# Patient Record
Sex: Female | Born: 1970 | Race: White | Hispanic: No | Marital: Married | Smoking: Former smoker
Health system: Southern US, Community
[De-identification: ages and names within clinical notes are randomized; demographics above are authoritative.]

## PROBLEM LIST (undated history)

## (undated) DIAGNOSIS — O039 Complete or unspecified spontaneous abortion without complication: Secondary | ICD-10-CM

## (undated) DIAGNOSIS — B977 Papillomavirus as the cause of diseases classified elsewhere: Secondary | ICD-10-CM

## (undated) DIAGNOSIS — T7840XA Allergy, unspecified, initial encounter: Secondary | ICD-10-CM

## (undated) HISTORY — DX: Allergy, unspecified, initial encounter: T78.40XA

## (undated) HISTORY — DX: Papillomavirus as the cause of diseases classified elsewhere: B97.7

## (undated) HISTORY — DX: Complete or unspecified spontaneous abortion without complication: O03.9

---

## 2005-09-15 HISTORY — PX: COLONOSCOPY: SHX174

## 2008-12-14 HISTORY — PX: BASAL CELL CARCINOMA EXCISION: SHX1214

## 2010-10-23 ENCOUNTER — Encounter: Payer: Self-pay | Admitting: Family Medicine

## 2010-11-21 ENCOUNTER — Ambulatory Visit (INDEPENDENT_AMBULATORY_CARE_PROVIDER_SITE_OTHER): Payer: BC Managed Care – PPO | Admitting: Family Medicine

## 2010-11-21 ENCOUNTER — Other Ambulatory Visit: Payer: Self-pay | Admitting: Family Medicine

## 2010-11-21 ENCOUNTER — Other Ambulatory Visit (HOSPITAL_COMMUNITY)
Admission: RE | Admit: 2010-11-21 | Discharge: 2010-11-21 | Disposition: A | Discharge status: Home / Self Care | Payer: BC Managed Care – PPO | Source: Ambulatory Visit | Attending: Family Medicine | Admitting: Family Medicine

## 2010-11-21 ENCOUNTER — Encounter: Payer: Self-pay | Admitting: Family Medicine

## 2010-11-21 DIAGNOSIS — Z01419 Encounter for gynecological examination (general) (routine) without abnormal findings: Secondary | ICD-10-CM | POA: Insufficient documentation

## 2010-11-21 DIAGNOSIS — Z85828 Personal history of other malignant neoplasm of skin: Secondary | ICD-10-CM | POA: Insufficient documentation

## 2010-11-21 DIAGNOSIS — Z1159 Encounter for screening for other viral diseases: Secondary | ICD-10-CM | POA: Insufficient documentation

## 2010-11-26 ENCOUNTER — Other Ambulatory Visit: Payer: Self-pay | Admitting: Family Medicine

## 2010-11-26 DIAGNOSIS — Z1231 Encounter for screening mammogram for malignant neoplasm of breast: Secondary | ICD-10-CM

## 2010-11-26 NOTE — Assessment & Plan Note (Signed)
Summary: NOV:CPE and pap   Vital Signs:  Patient profile:   40 year old female Height:      72 inches Weight:      178 pounds Pulse rate:   74 / minute BP sitting:   140 / 91  (right arm) Cuff size:   regular  Vitals Entered By: Avon Gully CMA, Duncan Dull) (November 21, 2010 10:03 AM) CC: NP-est care    CC:  NP-est care.  History of Present Illness: Has had some pigastric discomfort scine went to the [place]. Complete course of prilosec OTC for 14 day and that did help. Her mother had colon Ca at age 23 so she needs her screening now. Diud have one 5 years ago when lived in [place].   Habits & Providers  Alcohol-Tobacco-Diet     Alcohol drinks/day: 1     Tobacco Status: quit     Year Quit: 1992  Exercise-Depression-Behavior     Does Patient Exercise: yes     STD Risk: never     Drug Use: no     Seat Belt Use: always  Current Medications (verified): 1)  Ortho Tri-Cyclen Lo 0.18/0.215/0.25 Mg-25 Mcg Tabs (Norgestim-Eth Estrad Triphasic) 2)  Claritin 10 Mg Tabs (Loratadine)  Allergies (verified): No Known Drug Allergies  Comments:  Nurse/Medical Assistant: The patient's medications and allergies were reviewed with the patient and were updated in the Medication and Allergy Lists. Avon Gully CMA, Duncan Dull) (November 21, 2010 10:07 AM)  Past History:  Past Medical History: 1 miscarriage  Past Surgical History: Mohs/repair to left eye for Basal Cell Cancer 12/2008  Family History: GM with DM Father with hi chol, HTN Mom with colon Ca, age 66  Social History: Tree surgeon. Self employed.  MBA.  Married to [place] with 2 boys.   Former Smoker, quit 1992 Alcohol use-yes Drug use-no Regular exercise-yes 2 caffeinated drinks per day. Smoking Status:  quit Does Patient Exercise:  yes STD Risk:  never Drug Use:  no Seat Belt Use:  always  Review of Systems       No fever/sweats/weakness, unexplained weight loss/gain.  No vison changes.  No  difficulty hearing/ringing in ears, hay fever/allergies.  No chest pain/discomfort, palpitations.  No Br lump/nipple discharge.  No cough/wheeze.  No blood in BM, nausea/vomiting/diarrhea.  No nighttime urination, leaking urine, unusual vaginal bleeding, discharge (penis or vagina).  No muscle/joint pain. No rash, change in mole.  No HA, memory loss.  No anxiety, sleep d/o, depression.  No easy bruising/bleeding, unexplained lump   Physical Exam  General:  Well-developed,well-nourished,in no acute distress; alert,appropriate and cooperative throughout examination Head:  Normocephalic and atraumatic without obvious abnormalities. No apparent alopecia or balding. Eyes:  No corneal or conjunctival inflammation noted. EOMI. Perrla.  Ears:  External ear exam shows no significant lesions or deformities.  Otoscopic examination reveals clear canals, tympanic membranes are intact bilaterally without bulging, retraction, inflammation or discharge. Hearing is grossly normal bilaterally. Nose:  External nasal examination shows no deformity or inflammation.  Mouth:  Oral mucosa and oropharynx without lesions or exudates.  Teeth in good repair. Neck:  No deformities, masses, or tenderness noted. Chest Wall:  No deformities, masses, or tenderness noted. Breasts:  No mass, nodules, thickening, tenderness, bulging, retraction, inflamation, nipple discharge or skin changes noted.   Lungs:  Normal respiratory effort, chest expands symmetrically. Lungs are clear to auscultation, no crackles or wheezes. Heart:  Normal rate and regular rhythm. S1 and S2 normal without gallop, murmur,  click, rub or other extra sounds. Abdomen:  Bowel sounds positive,abdomen soft and non-tender without masses, organomegaly or hernias noted. Genitalia:  Normal introitus for age, no external lesions, no vaginal discharge, mucosa pink and moist, no vaginal or cervical lesions, no vaginal atrophy, no friaility or hemorrhage, normal uterus size  and position, no adnexal masses or tenderness Msk:  No deformity or scoliosis noted of thoracic or lumbar spine.   Pulses:  R and L carotid,radial,dorsalis pedis and posterior tibial pulses are full and equal bilaterally Extremities:  No clubbing, cyanosis, edema, or deformity noted with normal full range of motion of all joints.   Neurologic:  No cranial nerve deficits noted. Station and gait are normal.  Sensory, motor and coordinative functions appear intact. Skin:  no rashes.   Cervical Nodes:  No lymphadenopathy noted Axillary Nodes:  No palpable lymphadenopathy Psych:  Cognition and judgment appear intact. Alert and cooperative with normal attention span and concentration. No apparent delusions, illusions, hallucinations   Impression & Recommendations:  Problem # 1:  ROUTINE GYNECOLOGICAL EXAMINATION (ICD-V72.31)  Orders: T-Comprehensive Metabolic Panel ([id number]) T-Lipid Profile ([id number]) T-Mammography Bilateral Screening (30865)  Complete Medication List: 1)  Ortho Tri-cyclen Lo 0.18/0.215/0.25 Mg-25 Mcg Tabs (Norgestim-eth estrad triphasic) 2)  Claritin 10 Mg Tabs (Loratadine)  Other Orders: Gastroenterology Referral (GI) Dermatology Referral (Derma)  Patient Instructions: 1)  We will call you with the colon screening referral 2)  We will call you with the labs results.   3)  Keep up the regular exercise.  4)  Take calcium +vitamin D daily.  Prescriptions: ORTHO TRI-CYCLEN LO 0.18/0.215/0.25 MG-25 MCG TABS (NORGESTIM-ETH ESTRAD TRIPHASIC)   #90 x 3   Entered and Authorized by:   Nani Gasser MD   Signed by:   Nani Gasser MD on 11/21/2010   Method used:   Print then Give to Patient   RxID:   [id number]    Orders Added: 1)  New Patient age 50-64 [99386] 2)  T-Comprehensive Metabolic Panel [[id number]] 3)  T-Lipid Profile [[id number]] 4)  T-Mammography Bilateral Screening [77057] 5)  Gastroenterology Referral [GI] 6)  Dermatology  Referral [Derma]   Immunization History:  Tetanus/Td Immunization History:    Tetanus/Td:  historical (09/15/2009)  Influenza Immunization History:    Influenza:  state fluvax nasal (06/15/2010)   Immunization History:  Tetanus/Td Immunization History:    Tetanus/Td:  Historical (09/15/2009)  Influenza Immunization History:    Influenza:  State Fluvax Nasal (06/15/2010)   Immunization History:  Tetanus/Td Immunization History:    Tetanus/Td:  historical (09/15/2009)  Influenza Immunization History:    Influenza:  state fluvax nasal (06/15/2010)    Preventive Care Screening  Colonoscopy:    Date:  09/15/2005    Next Due:  09/2010    Results:  normal   Last Tetanus Booster:    Date:  09/15/2009    Results:  Historical

## 2010-11-28 ENCOUNTER — Encounter: Payer: Self-pay | Admitting: Family Medicine

## 2010-11-28 LAB — CYTOLOGY - PAP: Pap Smear: NORMAL

## 2010-11-29 LAB — CONVERTED CEMR LAB
ALT: 15 units/L (ref 0–35)
AST: 21 units/L (ref 0–37)
Alkaline Phosphatase: 33 units/L — ABNORMAL LOW (ref 39–117)
Glucose, Bld: 86 mg/dL (ref 70–99)
LDL Cholesterol: 90 mg/dL (ref 0–99)
Potassium: 5 meq/L (ref 3.5–5.3)
Sodium: 137 meq/L (ref 135–145)
Total Bilirubin: 0.5 mg/dL (ref 0.3–1.2)
Total Protein: 6.5 g/dL (ref 6.0–8.3)
Triglycerides: 120 mg/dL (ref <150)
VLDL: 24 mg/dL (ref 0–40)

## 2010-12-03 NOTE — Letter (Signed)
Summary: Registration, Billing Information  Registration, Billing Information   Imported By: Maryln Gottron 11/25/2010 15:23:12  _____________________________________________________________________  External Attachment:    Type:   Image     Comment:   External Document

## 2010-12-10 ENCOUNTER — Ambulatory Visit
Admission: RE | Admit: 2010-12-10 | Discharge: 2010-12-10 | Disposition: A | Discharge status: Home / Self Care | Payer: BC Managed Care – PPO | Source: Ambulatory Visit | Attending: Family Medicine | Admitting: Family Medicine

## 2010-12-10 DIAGNOSIS — Z1231 Encounter for screening mammogram for malignant neoplasm of breast: Secondary | ICD-10-CM

## 2010-12-11 ENCOUNTER — Encounter: Payer: Self-pay | Admitting: Family Medicine

## 2010-12-17 ENCOUNTER — Telehealth: Payer: Self-pay | Admitting: *Deleted

## 2010-12-17 ENCOUNTER — Encounter: Payer: Self-pay | Admitting: Family Medicine

## 2010-12-17 NOTE — Telephone Encounter (Signed)
Message copied by Avon Gully on Tue Dec 17, 2010  2:59 PM ------      Message from: Nani Gasser      Created: Thu Dec 12, 2010  4:11 PM       Call pt: normal mammogram.

## 2010-12-17 NOTE — Telephone Encounter (Signed)
Pt.notified

## 2011-01-20 ENCOUNTER — Encounter: Payer: Self-pay | Admitting: Family Medicine

## 2011-01-23 ENCOUNTER — Ambulatory Visit (INDEPENDENT_AMBULATORY_CARE_PROVIDER_SITE_OTHER): Payer: BC Managed Care – PPO | Admitting: Family Medicine

## 2011-01-23 ENCOUNTER — Telehealth: Payer: Self-pay | Admitting: Family Medicine

## 2011-01-23 DIAGNOSIS — H109 Unspecified conjunctivitis: Secondary | ICD-10-CM

## 2011-01-23 DIAGNOSIS — S99929A Unspecified injury of unspecified foot, initial encounter: Secondary | ICD-10-CM

## 2011-01-23 DIAGNOSIS — J45990 Exercise induced bronchospasm: Secondary | ICD-10-CM | POA: Insufficient documentation

## 2011-01-23 MED ORDER — ALBUTEROL SULFATE HFA 108 (90 BASE) MCG/ACT IN AERS: 2.0000 | INHALATION_SPRAY | Freq: Four times a day (QID) | RESPIRATORY_TRACT | Status: DC | PRN

## 2011-01-23 MED ORDER — AZITHROMYCIN 1 % OP SOLN: 1.0000 [drp] | Freq: Two times a day (BID) | OPHTHALMIC | Status: AC

## 2011-01-23 NOTE — Progress Notes (Signed)
  Subjective:    Patient ID: Tammy Hoffman, female    DOB: 1971-01-15, 40 y.o.   MRN: [id number]  HPI Hx of seasonal allergies with post nasal drip but left eye started feeling ithcy. Started systane eye drops and that didn't help. Had also tried alavert eye drops and made the eye less dry.  Eye has been watering some.  Small amt of crust in the AM.  Usually rubs with a wet wash cloths.  Doesn't wear contacts.  She complains that the upper inner quadrant of her eyeball is where she is noticing the itchiness and discomfort. No vision change or headache.  Left foot , 3rd toe, thinks may have fractured it. She said she tripped over something and wonders if it may be fractured. It has been swollen and bruised. She says it is very tender but was still able to get a workout class today.  Hx of exercise induced asthma.  She's been working out really hard for the last year and says every time she takes this one class would have to run up and down the stairs she gets extremely short of breath. She said she looks around at the other classmates and notices that they are working hard to breathe but not as much as she has. Since she's been doing this class for almost a year she felt like her endurance should be more than he is. She has not used an inhaler since childhood. She used to play basketball as a teenager and says of inhaler use to help her. She denies any fever cough or other URI symptoms.  Review of Systems     Objective:   Physical Exam  Constitutional: She appears well-developed and well-nourished.  HENT:  Head: Normocephalic and atraumatic.  Eyes: EOM are normal. Pupils are equal, round, and reactive to light.        Conjunctiva are mildly inflamed bilaterally. I did use a fluorescin stain on the left eye and there was a small scratch over the upper inner quadrant on the iris.  Cardiovascular: Normal rate, regular rhythm and normal heart sounds.   Pulmonary/Chest: Effort normal and breath sounds  normal.  Musculoskeletal:       Left foot third toe is bruised and swollen. Normal range of motion slight tenderness over the distal joint.  Skin: Skin is warm and dry.          Assessment & Plan:  Left eye with abrasion-Cipro drops were applied in the office and a prescription for azithromycin was sent to her pharmacy to start tomorrow. If she is not feeling significantly better within 2-3 days she is to call the office. And if she has any sudden vision changes she is to also call the office immediately.  Toe injury-it is possible she has a fracture of ih be given x-ray. But likely will improve if she just buddy taped her toes. I explained how to do this. She can also wear a flat shoe and we can provide a postop shoe if she is having significant discomfort. She felt like she would just buddy tape the toes she was able to get her workout class.

## 2011-01-23 NOTE — Telephone Encounter (Signed)
Pt called and has been having LT eye itchy, red, tearing, burning, and feeling like something in it.  Very dry feeling yesterday and used systane eye gtts and alavert eye gtts but not helping.  Denies fever; however, sneezing, and clear drainage today from LY eye.  Uses [facility].  NKDA.  Wants to get RX eye gtts. Please advise. Plan:  Routed to Dr. Marlyne Beards, LPN [facility]

## 2011-01-23 NOTE — Assessment & Plan Note (Signed)
She has a childhood history of exercise-induced asthma I think it would be okay to do a trial of albuterol preexercise before class. It does help significantly then we can continue its use and she might use it before her other classes to see if she has better endurance. If this does not help her symptoms then we can consider further testing with spirometry or full PFTs. We can also get a chest x-ray to rule out other lesions.

## 2011-01-23 NOTE — Telephone Encounter (Deleted)
Pt notified needs appt.  Pt can come today, therefore, will schedule appt for today with provider for 1:00pm dbl-book and okayed by Dr. Linford Arnold. Jarvis Newcomer, LPN [facility]

## 2011-01-23 NOTE — Telephone Encounter (Deleted)
Pt notified needs appt today and Dr. Linford Arnold said okay to double book her for acute visit.  Pt willing to come for appt today at 1:00pm. Added dbl-book on to the schedule . Jarvis Newcomer, LPN [facility] Dimes

## 2011-01-23 NOTE — Telephone Encounter (Signed)
Needs appt. Can double book for today if needed.

## 2011-01-28 ENCOUNTER — Encounter: Payer: Self-pay | Admitting: Family Medicine

## 2011-08-18 ENCOUNTER — Encounter: Payer: Self-pay | Admitting: Family Medicine

## 2011-08-18 ENCOUNTER — Ambulatory Visit (INDEPENDENT_AMBULATORY_CARE_PROVIDER_SITE_OTHER): Payer: BC Managed Care – PPO | Admitting: Family Medicine

## 2011-08-18 DIAGNOSIS — M2140 Flat foot [pes planus] (acquired), unspecified foot: Secondary | ICD-10-CM

## 2011-08-18 DIAGNOSIS — M25579 Pain in unspecified ankle and joints of unspecified foot: Secondary | ICD-10-CM

## 2011-08-18 NOTE — Patient Instructions (Signed)
Please give her the phone number for Dr. Norton Blizzard in [facility] for orthotics.

## 2011-08-18 NOTE — Progress Notes (Signed)
  Subjective:    Patient ID: Tammy Hoffman, female    DOB: Jun 01, 1971, 40 y.o.   MRN: [id number]  HPI Started working out at the gym regularly about 5 years ago. Noticed her feet would be sore and would try to change shoes. Had a nerve problem in her right foot and had to stop wearing flipflops.  Says her feet have flattened. Says used to be a size 11 and now a 13.  Feels she is starting to pronate. They hurt a lot at the bottom of the medial malleolus.  Pain is in both but worse in the left foot.  Worse after she works out.  She now has adjust her workout bc of this. Occ uses aleve and ice packs and stretches.    Review of Systems     Objective:   Physical Exam  Feet with DP pulses 2+. Ankles with NROM.  Toes are nontender.  Ankles are nontender. No swelling or redness. 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       Assessment & Plan:  Foot pain - pronation. Arch collapse. Discussed the need for orthotics. Will refer to [facility] in [facility].  I don't want this to affect her ability to work out and continue her exercise regimen.

## 2011-08-19 ENCOUNTER — Ambulatory Visit (INDEPENDENT_AMBULATORY_CARE_PROVIDER_SITE_OTHER): Payer: BC Managed Care – PPO | Admitting: Family Medicine

## 2011-08-19 ENCOUNTER — Encounter: Payer: Self-pay | Admitting: Family Medicine

## 2011-08-19 DIAGNOSIS — M79609 Pain in unspecified limb: Secondary | ICD-10-CM

## 2011-08-19 DIAGNOSIS — M79673 Pain in unspecified foot: Secondary | ICD-10-CM

## 2011-08-19 DIAGNOSIS — M2140 Flat foot [pes planus] (acquired), unspecified foot: Secondary | ICD-10-CM

## 2011-08-21 ENCOUNTER — Encounter: Payer: Self-pay | Admitting: Family Medicine

## 2011-08-21 DIAGNOSIS — M79673 Pain in unspecified foot: Secondary | ICD-10-CM | POA: Insufficient documentation

## 2011-08-21 DIAGNOSIS — M2140 Flat foot [pes planus] (acquired), unspecified foot: Secondary | ICD-10-CM | POA: Insufficient documentation

## 2011-08-21 NOTE — Progress Notes (Signed)
Subjective:    Patient ID: Tammy Hoffman, female    DOB: 07/19/1971, 40 y.o.   MRN: [id number]  PCP: Dr. Linford Arnold  HPI 40 yo F here for bilateral foot pain.  Patient denies known injury. She is very active in working out, running, cross training. States over past 5 years has had intermittent but worsening bilateral foot pain. Worse at end of the day and after doing jumping activities. States pain is not in a consistent location. Has developed worsening pes planus over the years as well. Tried superfeet, different shoes - has not tried custom orthotics in the past. No bruising. No numbness or tingling.  Past Medical History  Diagnosis Date  . Miscarriage   . Allergy   . Asthma     Current Outpatient Prescriptions on File Prior to Visit  Medication Sig Dispense Refill  . albuterol (PROAIR HFA) 108 (90 BASE) MCG/ACT inhaler Inhale 2 puffs into the lungs every 6 (six) hours as needed for wheezing.  1 Inhaler  2  . loratadine (CLARITIN) 10 MG tablet Take 10 mg by mouth daily.        . Norgestimate-Ethinyl Estradiol Triphasic (ORTHO TRI-CYCLEN LO) 0.18/0.215/0.25 MG-25 MCG tablet Take 1 tablet by mouth daily.          Past Surgical History  Procedure Date  . Basal cell carcinoma excision 12/2008    Left eye  . Colonoscopy 09/15/2005    No Known Allergies  History   Social History  . Marital Status: Married    Spouse Name: Molli Hazard    Number of Children: 2  . Years of Education: MBA   Occupational History  . computer analyst     self employed   Social History Main Topics  . Smoking status: Former Smoker    Quit date: 09/15/1990  . Smokeless tobacco: Not on file  . Alcohol Use: Yes  . Drug Use: No  . Sexually Active: Not on file   Other Topics Concern  . Not on file   Social History Narrative   Regular exercise.  2 caffeinated drinks per day.      Family History  Problem Relation Age of Onset  . Diabetes      GM  . Hyperlipidemia Father   . Hypertension  Father   . Colon cancer Mother 36  . Diabetes Maternal Uncle   . Diabetes Maternal Grandmother   . Heart attack Maternal Grandfather   . Hyperlipidemia Paternal Grandmother   . Hypertension Paternal Grandmother     BP 117/82  Pulse 76  Temp(Src) 98.1 F (36.7 C) (Oral)  Ht 5\' 11"  (1.803 m)  Wt 170 lb (77.111 kg)  BMI 23.71 kg/m2  LMP 08/18/2011  Review of Systems See HPI.    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 No leg length inequality. No transverse arch breakdown. No hallux valgus or rigidus. No other deformity, swelling, or bruising. No focal TTP throughout foot or ankle. FROM with 5/5 strength all motions of ankle and toes. Negative ant drawer and talar tilt. NVI distally.    Assessment & Plan:  1. Bilateral foot pain - By exam and patient's history does not appear she has a focal finding that would suggest a tendinopathy, stress fracture, other abnormality that would lend itself to a specific treatment plan.  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  Will try  comforthotics with small scaphoid pads over next month.  Depending on if she does well with these or likes them, advised her at f/u in 1 month would either make her custom orthotics or show her how to order these comforthotics from [facility].  Patient agreeable to treatment plan.

## 2011-08-21 NOTE — Assessment & Plan Note (Signed)
By exam and patient's history does not appear she has a focal finding that would suggest a tendinopathy, stress fracture, other abnormality that would lend itself to a specific treatment plan. 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 Will try comforthotics with small scaphoid pads over next month. Depending on if she does well with these or likes them, advised her at f/u in 1 month would either make her custom orthotics or show her how to order these comforthotics from [facility]. Patient agreeable to treatment plan.

## 2011-09-05 ENCOUNTER — Encounter: Payer: Self-pay | Admitting: Family Medicine

## 2011-09-30 ENCOUNTER — Ambulatory Visit (INDEPENDENT_AMBULATORY_CARE_PROVIDER_SITE_OTHER): Payer: BC Managed Care – PPO | Admitting: Family Medicine

## 2011-09-30 ENCOUNTER — Encounter: Payer: Self-pay | Admitting: Family Medicine

## 2011-09-30 VITALS — BP 132/89 | HR 67 | Temp 98.2°F | Ht 71.0 in | Wt 170.0 lb

## 2011-09-30 DIAGNOSIS — M79609 Pain in unspecified limb: Secondary | ICD-10-CM

## 2011-09-30 DIAGNOSIS — M79673 Pain in unspecified foot: Secondary | ICD-10-CM

## 2011-09-30 NOTE — Progress Notes (Signed)
Subjective:    Patient ID: Tammy Hoffman, female    DOB: April 08, 1971, 41 y.o.   MRN: [id number]  PCP: Dr. Linford Arnold  HPI  41 yo F here for f/u bilateral foot pain.  12/4: Patient denies known injury. She is very active in working out, running, cross training. States over past 5 years has had intermittent but worsening bilateral foot pain. Worse at end of the day and after doing jumping activities. States pain is not in a consistent location. Has developed worsening pes planus over the years as well. Tried superfeet, different shoes - has not tried custom orthotics in the past. No bruising. No numbness or tingling.  1/15: Patient reports pain is much better. Able to work out without limitations though still notices if she does a lot of jumping activities or stairs she has soreness in bilateral feet after workouts. Would like to order temporary insoles from [facility] - done very well with these and pleased with progress. No new injuries.  Past Medical History  Diagnosis Date  . Miscarriage   . Allergy   . Asthma     Current Outpatient Prescriptions on File Prior to Visit  Medication Sig Dispense Refill  . albuterol (PROAIR HFA) 108 (90 BASE) MCG/ACT inhaler Inhale 2 puffs into the lungs every 6 (six) hours as needed for wheezing.  1 Inhaler  2  . loratadine (CLARITIN) 10 MG tablet Take 10 mg by mouth daily.        . Norgestimate-Ethinyl Estradiol Triphasic (ORTHO TRI-CYCLEN LO) 0.18/0.215/0.25 MG-25 MCG tablet Take 1 tablet by mouth daily.          Past Surgical History  Procedure Date  . Basal cell carcinoma excision 12/2008    Left eye  . Colonoscopy 09/15/2005    No Known Allergies  History   Social History  . Marital Status: Married    Spouse Name: Molli Hazard    Number of Children: 2  . Years of Education: MBA   Occupational History  . computer analyst     self employed   Social History Main Topics  . Smoking status: Former Smoker    Quit date: 09/15/1990  .  Smokeless tobacco: Not on file  . Alcohol Use: Yes  . Drug Use: No  . Sexually Active: Not on file   Other Topics Concern  . Not on file   Social History Narrative   Regular exercise.  2 caffeinated drinks per day.      Family History  Problem Relation Age of Onset  . Diabetes      GM  . Hyperlipidemia Father   . Hypertension Father   . Colon cancer Mother 46  . Diabetes Maternal Uncle   . Diabetes Maternal Grandmother   . Heart attack Maternal Grandfather   . Hyperlipidemia Paternal Grandmother   . Hypertension Paternal Grandmother     BP 132/89  Pulse 67  Temp(Src) 98.2 F (36.8 C) (Oral)  Ht 5\' 11"  (1.803 m)  Wt 170 lb (77.111 kg)  BMI 23.71 kg/m2  Review of Systems  See HPI.    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 No transverse arch breakdown. No hallux valgus or rigidus. No other deformity, swelling, or bruising. No focal TTP throughout foot or ankle. FROM with 5/5 strength all motions of ankle and toes. Negative ant drawer and talar tilt. NVI distally.    Assessment & Plan:  1. Bilateral foot pain -  Exam reassuring again today.  No evidence of findings of a tendinopathy, stress fracture, other abnormality that would lend itself to a specific treatment plan.  Improved with sports insoles + scaphoid pads.  Shown today how to order these from [facility].  To check with her insurance to see if they cover custom orthotics - advised her if they do would be happy to make her a pair of these.  Otherwise f/u prn.

## 2011-09-30 NOTE — Assessment & Plan Note (Signed)
Exam reassuring again today.  No evidence of findings of a tendinopathy, stress fracture, other abnormality that would lend itself to a specific treatment plan.  Improved with sports insoles + scaphoid pads.  Shown today how to order these from [facility].  To check with her insurance to see if they cover custom orthotics - advised her if they do would be happy to make her a pair of these.  Otherwise f/u prn.

## 2011-11-24 ENCOUNTER — Encounter: Payer: Self-pay | Admitting: Physician Assistant

## 2011-11-24 ENCOUNTER — Ambulatory Visit (INDEPENDENT_AMBULATORY_CARE_PROVIDER_SITE_OTHER): Payer: BC Managed Care – PPO | Admitting: Physician Assistant

## 2011-11-24 VITALS — BP 138/96 | HR 92 | Ht 71.0 in | Wt 174.0 lb

## 2011-11-24 DIAGNOSIS — R030 Elevated blood-pressure reading, without diagnosis of hypertension: Secondary | ICD-10-CM

## 2011-11-24 DIAGNOSIS — M545 Low back pain: Secondary | ICD-10-CM

## 2011-11-24 MED ORDER — CYCLOBENZAPRINE HCL 5 MG PO TABS: 7.5000 mg | ORAL_TABLET | Freq: Three times a day (TID) | ORAL | Status: DC | PRN

## 2011-11-24 MED ORDER — IBUPROFEN 800 MG PO TABS: 800.0000 mg | ORAL_TABLET | Freq: Three times a day (TID) | ORAL | Status: AC | PRN

## 2011-11-24 NOTE — Progress Notes (Signed)
  Subjective:    Patient ID: Tammy Hoffman, female    DOB: 21-Aug-1971, 41 y.o.   MRN: [id number]  HPI Patient presents to the clinic for lumbar back pain. Patient has a history of lower back pain that will acutely get worse and then resolve. The most recent episode started Thursday night when her lower back muscle felt tight. She works out every day and does not remember an injury. She continued to feel tight until Saturday when she got a massage. After the massage on Saturday(2 days ago) everything got worse. She rates pain 7/10 and has trouble walking or moving at all. Laying down makes it feel the best. Walking and standing makes it feel the worse. She denies any bowel or urinary disfunction. She has had some radiation of pain down left leg. She has tried heating pad and perigylesic. It has not helped.     Review of Systems     Objective:   Physical Exam  Constitutional: She is oriented to person, place, and time. She appears well-developed and well-nourished.  HENT:  Head: Normocephalic and atraumatic.  Cardiovascular: Normal rate, regular rhythm and normal heart sounds.   Pulmonary/Chest: Effort normal and breath sounds normal. She has no wheezes.  Musculoskeletal:       Patient was not able to do any forward flexion or backward extension at the waist. She was able to minimally twist at the waist. Passive ROM of both legs while laying on the table was full ROM. Positive straight leg test of left leg it was negative on right leg. Not able to test strength patient could not resist at all. Tenderness to palpation over left lumbar area.  Neurological: She is alert and oriented to person, place, and time. She has normal reflexes.  Psychiatric: She has a normal mood and affect. Her behavior is normal.          Assessment & Plan:  Low back strain- Give Ibuprofen 800mg  up to three times a day. Start Flexeril to start up three times a day. Start exerices and stretches. Encouraged patient to  keep moving as much as possible not to just lay in the bed. Patient has trip planned for [place] in 5 days. Will consider giving pain medication if not improved before then.    Elevated blood pressure- Discussed elevated blood pressure and salt intake as well as diet. Check bp a couple times a week and bring in to CPE. Patient has family history of HTN and we discussed genetics with hypertension.

## 2011-11-24 NOTE — Patient Instructions (Addendum)
Give Ibuprofen 800mg  up to three times a day. Start Flexeril to start up three times a day. Start exerices and stretches. Discussed elevated blood pressure and salt intake due to diet. Check bp a couple times a week and bring in to CPE.   Low Back Strain with Rehab A strain is an injury in which a tendon or muscle is torn. The muscles and tendons of the lower back are vulnerable to strains. However, these muscles and tendons are very strong and require a great force to be injured. Strains are classified into three categories. Grade 1 strains cause pain, but the tendon is not lengthened. Grade 2 strains include a lengthened ligament, due to the ligament being stretched or partially ruptured. With grade 2 strains there is still function, although the function may be decreased. Grade 3 strains involve a complete tear of the tendon or muscle, and function is usually impaired. SYMPTOMS   Pain in the lower back.   Pain that affects one side more than the other.   Pain that gets worse with movement and may be felt in the hip, buttocks, or back of the thigh.   Muscle spasms of the muscles in the back.   Swelling along the muscles of the back.   Loss of strength of the back muscles.   Crackling sound (crepitation) when the muscles are touched.  CAUSES  Lower back strains occur when a force is placed on the muscles or tendons that is greater than they can handle. Common causes of injury include:  Prolonged overuse of the muscle-tendon units in the lower back, usually from incorrect posture.   A single violent injury or force applied to the back.  RISK INCREASES WITH:  Sports that involve twisting forces on the spine or a lot of bending at the waist (football, rugby, weightlifting, bowling, golf, tennis, speed skating, racquetball, swimming, running, gymnastics, diving).   Poor strength and flexibility.   Failure to warm up properly before activity.   Family history of lower back pain or disk  disorders.   Previous back injury or surgery (especially fusion).   Poor posture with lifting, especially heavy objects.   Prolonged sitting, especially with poor posture.  PREVENTION   Learn and use proper posture when sitting or lifting (maintain proper posture when sitting, lift using the knees and legs, not at the waist).   Warm up and stretch properly before activity.   Allow for adequate recovery between workouts.   Maintain physical fitness:   Strength, flexibility, and endurance.   Cardiovascular fitness.  PROGNOSIS  If treated properly, lower back strains usually heal within 6 weeks. RELATED COMPLICATIONS   Recurring symptoms, resulting in a chronic problem.   Chronic inflammation, scarring, and partial muscle-tendon tear.   Delayed healing or resolution of symptoms.   Prolonged disability.  TREATMENT  Treatment first involves the use of ice and medicine, to reduce pain and inflammation. The use of strengthening and stretching exercises may help reduce pain with activity. These exercises may be performed at home or with a therapist. Severe injuries may require referral to a therapist for further evaluation and treatment, such as ultrasound. Your caregiver may advise that you wear a back brace or corset, to help reduce pain and discomfort. Often, prolonged bed rest results in greater harm then benefit. Corticosteroid injections may be recommended. However, these should be reserved for the most serious cases. It is important to avoid using your back when lifting objects. At night, sleep on your back  on a firm mattress with a pillow placed under your knees. If non-surgical treatment is unsuccessful, surgery may be needed.  MEDICATION   If pain medicine is needed, nonsteroidal anti-inflammatory medicines (aspirin and ibuprofen), or other minor pain relievers (acetaminophen), are often advised.   Do not take pain medicine for 7 days before surgery.   Prescription pain  relievers may be given, if your caregiver thinks they are needed. Use only as directed and only as much as you need.   Ointments applied to the skin may be helpful.   Corticosteroid injections may be given by your caregiver. These injections should be reserved for the most serious cases, because they may only be given a certain number of times.  HEAT AND COLD  Cold treatment (icing) should be applied for 10 to 15 minutes every 2 to 3 hours for inflammation and pain, and immediately after activity that aggravates your symptoms. Use ice packs or an ice massage.   Heat treatment may be used before performing stretching and strengthening activities prescribed by your caregiver, physical therapist, or athletic trainer. Use a heat pack or a warm water soak.  SEEK MEDICAL CARE IF:   Symptoms get worse or do not improve in 2 to 4 weeks, despite treatment.   You develop numbness, weakness, or loss of bowel or bladder function.   New, unexplained symptoms develop. (Drugs used in treatment may produce side effects.)  EXERCISES  RANGE OF MOTION (ROM) AND STRETCHING EXERCISES - Low Back Strain Most people with lower back pain will find that their symptoms get worse with excessive bending forward (flexion) or arching at the lower back (extension). The exercises which will help resolve your symptoms will focus on the opposite motion.  Your physician, physical therapist or athletic trainer will help you determine which exercises will be most helpful to resolve your lower back pain. Do not complete any exercises without first consulting with your caregiver. Discontinue any exercises which make your symptoms worse until you speak to your caregiver.  If you have pain, numbness or tingling which travels down into your buttocks, leg or foot, the goal of the therapy is for these symptoms to move closer to your back and eventually resolve. Sometimes, these leg symptoms will get better, but your lower back pain may  worsen. This is typically an indication of progress in your rehabilitation. Be very alert to any changes in your symptoms and the activities in which you participated in the 24 hours prior to the change. Sharing this information with your caregiver will allow him/her to most efficiently treat your condition.  These exercises may help you when beginning to rehabilitate your injury. Your symptoms may resolve with or without further involvement from your physician, physical therapist or athletic trainer. While completing these exercises, remember:   Restoring tissue flexibility helps normal motion to return to the joints. This allows healthier, less painful movement and activity.   An effective stretch should be held for at least 30 seconds.   A stretch should never be painful. You should only feel a gentle lengthening or release in the stretched tissue.  FLEXION RANGE OF MOTION AND STRETCHING EXERCISES: STRETCH - Flexion, Single Knee to Chest   Lie on a firm bed or floor with both legs extended in front of you.   Keeping one leg in contact with the floor, bring your opposite knee to your chest. Hold your leg in place by either grabbing behind your thigh or at your knee.   Pull  until you feel a gentle stretch in your lower back. Hold __________ seconds.   Slowly release your grasp and repeat the exercise with the opposite side.  Repeat __________ times. Complete this exercise __________ times per day.  STRETCH - Flexion, Double Knee to Chest   Lie on a firm bed or floor with both legs extended in front of you.   Keeping one leg in contact with the floor, bring your opposite knee to your chest.   Tense your stomach muscles to support your back and then lift your other knee to your chest. Hold your legs in place by either grabbing behind your thighs or at your knees.   Pull both knees toward your chest until you feel a gentle stretch in your lower back. Hold __________ seconds.   Tense your  stomach muscles and slowly return one leg at a time to the floor.  Repeat __________ times. Complete this exercise __________ times per day.  STRETCH - Low Trunk Rotation  Lie on a firm bed or floor. Keeping your legs in front of you, bend your knees so they are both pointed toward the ceiling and your feet are flat on the floor.   Extend your arms out to the side. This will stabilize your upper body by keeping your shoulders in contact with the floor.   Gently and slowly drop both knees together to one side until you feel a gentle stretch in your lower back. Hold for __________ seconds.   Tense your stomach muscles to support your lower back as you bring your knees back to the starting position. Repeat the exercise to the other side.  Repeat __________ times. Complete this exercise __________ times per day  EXTENSION RANGE OF MOTION AND FLEXIBILITY EXERCISES: STRETCH - Extension, Prone on Elbows   Lie on your stomach on the floor, a bed will be too soft. Place your palms about shoulder width apart and at the height of your head.   Place your elbows under your shoulders. If this is too painful, stack pillows under your chest.   Allow your body to relax so that your hips drop lower and make contact more completely with the floor.   Hold this position for __________ seconds.   Slowly return to lying flat on the floor.  Repeat __________ times. Complete this exercise __________ times per day.  RANGE OF MOTION - Extension, Prone Press Ups  Lie on your stomach on the floor, a bed will be too soft. Place your palms about shoulder width apart and at the height of your head.   Keeping your back as relaxed as possible, slowly straighten your elbows while keeping your hips on the floor. You may adjust the placement of your hands to maximize your comfort. As you gain motion, your hands will come more underneath your shoulders.   Hold this position __________ seconds.   Slowly return to lying  flat on the floor.  Repeat __________ times. Complete this exercise __________ times per day.  RANGE OF MOTION- Quadruped, Neutral Spine   Assume a hands and knees position on a firm surface. Keep your hands under your shoulders and your knees under your hips. You may place padding under your knees for comfort.   Drop your head and point your tail bone toward the ground below you. This will round out your lower back like an angry cat. Hold this position for __________ seconds.   Slowly lift your head and release your tail bone so that your  back sags into a large arch, like an old horse.   Hold this position for __________ seconds.   Repeat this until you feel limber in your lower back.   Now, find your "sweet spot." This will be the most comfortable position somewhere between the two previous positions. This is your neutral spine. Once you have found this position, tense your stomach muscles to support your lower back.   Hold this position for __________ seconds.  Repeat __________ times. Complete this exercise __________ times per day.  STRENGTHENING EXERCISES - Low Back Strain These exercises may help you when beginning to rehabilitate your injury. These exercises should be done near your "sweet spot." This is the neutral, low-back arch, somewhere between fully rounded and fully arched, that is your least painful position. When performed in this safe range of motion, these exercises can be used for people who have either a flexion or extension based injury. These exercises may resolve your symptoms with or without further involvement from your physician, physical therapist or athletic trainer. While completing these exercises, remember:   Muscles can gain both the endurance and the strength needed for everyday activities through controlled exercises.   Complete these exercises as instructed by your physician, physical therapist or athletic trainer. Increase the resistance and repetitions  only as guided.   You may experience muscle soreness or fatigue, but the pain or discomfort you are trying to eliminate should never worsen during these exercises. If this pain does worsen, stop and make certain you are following the directions exactly. If the pain is still present after adjustments, discontinue the exercise until you can discuss the trouble with your caregiver.  STRENGTHENING - Deep Abdominals, Pelvic Tilt  Lie on a firm bed or floor. Keeping your legs in front of you, bend your knees so they are both pointed toward the ceiling and your feet are flat on the floor.   Tense your lower abdominal muscles to press your lower back into the floor. This motion will rotate your pelvis so that your tail bone is scooping upwards rather than pointing at your feet or into the floor.   With a gentle tension and even breathing, hold this position for __________ seconds.  Repeat __________ times. Complete this exercise __________ times per day.  STRENGTHENING - Abdominals, Crunches   Lie on a firm bed or floor. Keeping your legs in front of you, bend your knees so they are both pointed toward the ceiling and your feet are flat on the floor. Cross your arms over your chest.   Slightly tip your chin down without bending your neck.   Tense your abdominals and slowly lift your trunk high enough to just clear your shoulder blades. Lifting higher can put excessive stress on the lower back and does not further strengthen your abdominal muscles.   Control your return to the starting position.  Repeat __________ times. Complete this exercise __________ times per day.  STRENGTHENING - Quadruped, Opposite UE/LE Lift   Assume a hands and knees position on a firm surface. Keep your hands under your shoulders and your knees under your hips. You may place padding under your knees for comfort.   Find your neutral spine and gently tense your abdominal muscles so that you can maintain this position. Your  shoulders and hips should form a rectangle that is parallel with the floor and is not twisted.   Keeping your trunk steady, lift your right hand no higher than your shoulder and then your  left leg no higher than your hip. Make sure you are not holding your breath. Hold this position __________ seconds.   Continuing to keep your abdominal muscles tense and your back steady, slowly return to your starting position. Repeat with the opposite arm and leg.  Repeat __________ times. Complete this exercise __________ times per day.  STRENGTHENING - Lower Abdominals, Double Knee Lift  Lie on a firm bed or floor. Keeping your legs in front of you, bend your knees so they are both pointed toward the ceiling and your feet are flat on the floor.   Tense your abdominal muscles to brace your lower back and slowly lift both of your knees until they come over your hips. Be certain not to hold your breath.   Hold __________ seconds. Using your abdominal muscles, return to the starting position in a slow and controlled manner.  Repeat __________ times. Complete this exercise __________ times per day.  POSTURE AND BODY MECHANICS CONSIDERATIONS - Low Back Strain Keeping correct posture when sitting, standing or completing your activities will reduce the stress put on different body tissues, allowing injured tissues a chance to heal and limiting painful experiences. The following are general guidelines for improved posture. Your physician or physical therapist will provide you with any instructions specific to your needs. While reading these guidelines, remember:  The exercises prescribed by your provider will help you have the flexibility and strength to maintain correct postures.   The correct posture provides the best environment for your joints to work. All of your joints have less wear and tear when properly supported by a spine with good posture. This means you will experience a healthier, less painful body.    Correct posture must be practiced with all of your activities, especially prolonged sitting and standing. Correct posture is as important when doing repetitive low-stress activities (typing) as it is when doing a single heavy-load activity (lifting).  RESTING POSITIONS Consider which positions are most painful for you when choosing a resting position. If you have pain with flexion-based activities (sitting, bending, stooping, squatting), choose a position that allows you to rest in a less flexed posture. You would want to avoid curling into a fetal position on your side. If your pain worsens with extension-based activities (prolonged standing, working overhead), avoid resting in an extended position such as sleeping on your stomach. Most people will find more comfort when they rest with their spine in a more neutral position, neither too rounded nor too arched. Lying on a non-sagging bed on your side with a pillow between your knees, or on your back with a pillow under your knees will often provide some relief. Keep in mind, being in any one position for a prolonged period of time, no matter how correct your posture, can still lead to stiffness. PROPER SITTING POSTURE In order to minimize stress and discomfort on your spine, you must sit with correct posture. Sitting with good posture should be effortless for a healthy body. Returning to good posture is a gradual process. Many people can work toward this most comfortably by using various supports until they have the flexibility and strength to maintain this posture on their own. When sitting with proper posture, your ears will fall over your shoulders and your shoulders will fall over your hips. You should use the back of the chair to support your upper back. Your lower back will be in a neutral position, just slightly arched. You may place a small pillow or folded towel  at the base of your lower back for support.  When working at a desk, create an  environment that supports good, upright posture. Without extra support, muscles tire, which leads to excessive strain on joints and other tissues. Keep these recommendations in mind: CHAIR:  A chair should be able to slide under your desk when your back makes contact with the back of the chair. This allows you to work closely.   The chair's height should allow your eyes to be level with the upper part of your monitor and your hands to be slightly lower than your elbows.  BODY POSITION  Your feet should make contact with the floor. If this is not possible, use a foot rest.   Keep your ears over your shoulders. This will reduce stress on your neck and lower back.  INCORRECT SITTING POSTURES  If you are feeling tired and unable to assume a healthy sitting posture, do not slouch or slump. This puts excessive strain on your back tissues, causing more damage and pain. Healthier options include:  Using more support, like a lumbar pillow.   Switching tasks to something that requires you to be upright or walking.   Talking a brief walk.   Lying down to rest in a neutral-spine position.  PROLONGED STANDING WHILE SLIGHTLY LEANING FORWARD  When completing a task that requires you to lean forward while standing in one place for a long time, place either foot up on a stationary 2-4 inch high object to help maintain the best posture. When both feet are on the ground, the lower back tends to lose its slight inward curve. If this curve flattens (or becomes too large), then the back and your other joints will experience too much stress, tire more quickly, and can cause pain. CORRECT STANDING POSTURES Proper standing posture should be assumed with all daily activities, even if they only take a few moments, like when brushing your teeth. As in sitting, your ears should fall over your shoulders and your shoulders should fall over your hips. You should keep a slight tension in your abdominal muscles to brace your  spine. Your tailbone should point down to the ground, not behind your body, resulting in an over-extended swayback posture.  INCORRECT STANDING POSTURES  Common incorrect standing postures include a forward head, locked knees and/or an excessive swayback. WALKING Walk with an upright posture. Your ears, shoulders and hips should all line-up. PROLONGED ACTIVITY IN A FLEXED POSITION When completing a task that requires you to bend forward at your waist or lean over a low surface, try to find a way to stabilize 3 out of 4 of your limbs. You can place a hand or elbow on your thigh or rest a knee on the surface you are reaching across. This will provide you more stability so that your muscles do not fatigue as quickly. By keeping your knees relaxed, or slightly bent, you will also reduce stress across your lower back. CORRECT LIFTING TECHNIQUES DO :   Assume a wide stance. This will provide you more stability and the opportunity to get as close as possible to the object which you are lifting.   Tense your abdominals to brace your spine. Bend at the knees and hips. Keeping your back locked in a neutral-spine position, lift using your leg muscles. Lift with your legs, keeping your back straight.   Test the weight of unknown objects before attempting to lift them.   Try to keep your elbows locked down at  your sides in order get the best strength from your shoulders when carrying an object.   Always ask for help when lifting heavy or awkward objects.  INCORRECT LIFTING TECHNIQUES DO NOT:   Lock your knees when lifting, even if it is a small object.   Bend and twist. Pivot at your feet or move your feet when needing to change directions.   Assume that you can safely pick up even a paper clip without proper posture.  Document Released: 09/01/2005 Document Revised: 08/21/2011 Document Reviewed: 12/14/2008 [facility] Patient Information 2012 [place], [place].

## 2011-12-09 ENCOUNTER — Other Ambulatory Visit: Payer: Self-pay | Admitting: Family Medicine

## 2011-12-09 DIAGNOSIS — Z1231 Encounter for screening mammogram for malignant neoplasm of breast: Secondary | ICD-10-CM

## 2011-12-29 ENCOUNTER — Encounter: Payer: Self-pay | Admitting: *Deleted

## 2012-01-01 ENCOUNTER — Ambulatory Visit (INDEPENDENT_AMBULATORY_CARE_PROVIDER_SITE_OTHER): Payer: BC Managed Care – PPO | Admitting: Family Medicine

## 2012-01-01 ENCOUNTER — Encounter: Payer: Self-pay | Admitting: Family Medicine

## 2012-01-01 ENCOUNTER — Other Ambulatory Visit (HOSPITAL_COMMUNITY)
Admission: RE | Admit: 2012-01-01 | Discharge: 2012-01-01 | Disposition: A | Discharge status: Home / Self Care | Payer: BC Managed Care – PPO | Source: Ambulatory Visit | Attending: Family Medicine | Admitting: Family Medicine

## 2012-01-01 VITALS — BP 132/82 | HR 66 | Ht 71.0 in | Wt 175.0 lb

## 2012-01-01 DIAGNOSIS — Z01419 Encounter for gynecological examination (general) (routine) without abnormal findings: Secondary | ICD-10-CM

## 2012-01-01 MED ORDER — NORGESTIM-ETH ESTRAD TRIPHASIC 0.18/0.215/0.25 MG-25 MCG PO TABS: 1.0000 | ORAL_TABLET | Freq: Every day | ORAL | Status: DC

## 2012-01-01 MED ORDER — ALBUTEROL SULFATE HFA 108 (90 BASE) MCG/ACT IN AERS: 2.0000 | INHALATION_SPRAY | Freq: Four times a day (QID) | RESPIRATORY_TRACT | Status: DC | PRN

## 2012-01-01 NOTE — Patient Instructions (Signed)
Start a regular exercise program and make sure you are eating a healthy diet Try to eat 4 servings of dairy a day or take a calcium supplement (500mg  twice a day). Your vaccines are up to date.  We will call you with the pap smear results.

## 2012-01-01 NOTE — Progress Notes (Signed)
  Subjective:     Tammy Hoffman is a 41 y.o. female and is here for a comprehensive physical exam. The patient reports no problems. Wants to renew her OCPs. She is doing well.  She has questions about the shingles vaccine.  She did see Dr. Pearletha Forge for orthotics and that has helped. She wonders if could take a supplement called Move-Free.  Has been tracking her BP at home and  Has been 120s/70.  She wants to know if should see a chirpractor.    History   Social History  . Marital Status: Married    Spouse Name: Molli Hazard    Number of Children: 2  . Years of Education: MBA   Occupational History  . computer analyst     self employed   Social History Main Topics  . Smoking status: Former Smoker    Quit date: 09/15/1990  . Smokeless tobacco: Not on file  . Alcohol Use: Yes  . Drug Use: No  . Sexually Active: Not on file   Other Topics Concern  . Not on file   Social History Narrative   Regular exercise.  2 caffeinated drinks per day.     Health Maintenance  Topic Date Due  . Influenza Vaccine  06/15/2012  . Pap Smear  11/27/2013  . Tetanus/tdap  09/16/2019    The following portions of the patient's history were reviewed and updated as appropriate: allergies, current medications, past family history, past medical history, past social history, past surgical history and problem list.  Review of Systems A comprehensive review of systems was negative.   Objective:    BP 132/82  Pulse 66  Ht 5\' 11"  (1.803 m)  Wt 175 lb (79.379 kg)  BMI 24.41 kg/m2  LMP 12/25/2011 General appearance: alert, cooperative and appears stated age Head: Normocephalic, without obvious abnormality, atraumatic Eyes: conj clear, EOMi, PEERLA Ears: normal TM's and external ear canals both ears Nose: Nares normal. Septum midline. Mucosa normal. No drainage or sinus tenderness. Throat: lips, mucosa, and tongue normal; teeth and gums normal Neck: no adenopathy, no carotid bruit, no JVD, supple, symmetrical,  trachea midline and thyroid not enlarged, symmetric, no tenderness/mass/nodules Back: symmetric, no curvature. ROM normal. No CVA tenderness. Lungs: clear to auscultation bilaterally Breasts: normal appearance, no masses or tenderness Heart: regular rate and rhythm, S1, S2 normal, no murmur, click, rub or gallop Abdomen: soft, non-tender; bowel sounds normal; no masses,  no organomegaly Pelvic: cervix normal in appearance, external genitalia normal, no adnexal masses or tenderness, no cervical motion tenderness, rectovaginal septum normal, uterus normal size, shape, and consistency and vagina normal without discharge Extremities: extremities normal, atraumatic, no cyanosis or edema Pulses: 2+ and symmetric Skin: Skin color, texture, turgor normal. No rashes or lesions Lymph nodes: Cervical, supraclavicular, and axillary nodes normal. Neurologic: Grossly normal    Assessment:    Healthy female exam.      Plan:     See After Visit Summary for Counseling Recommendations  Start a regular exercise program and make sure you are eating a healthy diet Try to eat 4 servings of dairy a day or take a calcium supplement (500mg  twice a day). Your vaccines are up to date.   BP looks fantastic today.    Doing well with the orthotics.    Renew OCPs and inhaler. Sent to [facility].    Ok for chiropractor for her back and neck.  I think this could be really helpful.

## 2012-01-02 LAB — CBC WITH DIFFERENTIAL/PLATELET
Eosinophils Relative: 1 % (ref 0–5)
HCT: 37.8 % (ref 36.0–46.0)
Hemoglobin: 12.1 g/dL (ref 12.0–15.0)
Lymphocytes Relative: 44 % (ref 12–46)
MCHC: 32 g/dL (ref 30.0–36.0)
MCV: 92.2 fL (ref 78.0–100.0)
Monocytes Absolute: 0.3 10*3/uL (ref 0.1–1.0)
Monocytes Relative: 5 % (ref 3–12)
Neutro Abs: 2.3 10*3/uL (ref 1.7–7.7)

## 2012-01-02 LAB — COMPLETE METABOLIC PANEL WITH GFR
AST: 24 U/L (ref 0–37)
Alkaline Phosphatase: 37 U/L — ABNORMAL LOW (ref 39–117)
GFR, Est Non African American: >89 mL/min
Glucose, Bld: 86 mg/dL (ref 70–99)
Potassium: 5 mEq/L (ref 3.5–5.3)
Sodium: 139 mEq/L (ref 135–145)
Total Bilirubin: 0.7 mg/dL (ref 0.3–1.2)
Total Protein: 6.5 g/dL (ref 6.0–8.3)

## 2012-01-02 LAB — LIPID PANEL
HDL: 83 mg/dL (ref >39)
LDL Cholesterol: 89 mg/dL (ref 0–99)
Total CHOL/HDL Ratio: 2.3 Ratio
VLDL: 15 mg/dL (ref 0–40)

## 2012-01-02 NOTE — Progress Notes (Signed)
Quick Note:  All labs are normal. ______ 

## 2012-01-06 ENCOUNTER — Ambulatory Visit
Admission: RE | Admit: 2012-01-06 | Discharge: 2012-01-06 | Disposition: A | Discharge status: Home / Self Care | Payer: BC Managed Care – PPO | Source: Ambulatory Visit | Attending: Family Medicine | Admitting: Family Medicine

## 2012-01-06 DIAGNOSIS — Z1231 Encounter for screening mammogram for malignant neoplasm of breast: Secondary | ICD-10-CM

## 2012-11-23 ENCOUNTER — Ambulatory Visit (INDEPENDENT_AMBULATORY_CARE_PROVIDER_SITE_OTHER): Payer: BC Managed Care – PPO | Admitting: Family Medicine

## 2012-11-23 ENCOUNTER — Encounter: Payer: Self-pay | Admitting: Family Medicine

## 2012-11-23 VITALS — BP 129/79 | HR 65 | Ht 71.0 in | Wt 171.0 lb

## 2012-11-23 DIAGNOSIS — Z0000 Encounter for general adult medical examination without abnormal findings: Secondary | ICD-10-CM

## 2012-11-23 DIAGNOSIS — J309 Allergic rhinitis, unspecified: Secondary | ICD-10-CM

## 2012-11-23 LAB — COMPLETE METABOLIC PANEL WITH GFR
ALT: 13 U/L (ref 0–35)
AST: 19 U/L (ref 0–37)
CO2: 26 mEq/L (ref 19–32)
Calcium: 9.6 mg/dL (ref 8.4–10.5)
Chloride: 105 mEq/L (ref 96–112)
Creat: 0.91 mg/dL (ref 0.50–1.10)
GFR, Est African American: >89 mL/min
Potassium: 4.3 mEq/L (ref 3.5–5.3)
Sodium: 139 mEq/L (ref 135–145)
Total Protein: 6.9 g/dL (ref 6.0–8.3)

## 2012-11-23 LAB — CBC WITH DIFFERENTIAL/PLATELET
Basophils Absolute: 0 10*3/uL (ref 0.0–0.1)
Lymphocytes Relative: 39 % (ref 12–46)
Lymphs Abs: 1.9 10*3/uL (ref 0.7–4.0)
MCV: 88.3 fL (ref 78.0–100.0)
Neutro Abs: 2.7 10*3/uL (ref 1.7–7.7)
Neutrophils Relative %: 55 % (ref 43–77)
Platelets: 290 10*3/uL (ref 150–400)
RBC: 4.29 MIL/uL (ref 3.87–5.11)
RDW: 13.2 % (ref 11.5–15.5)
WBC: 4.9 10*3/uL (ref 4.0–10.5)

## 2012-11-23 LAB — LIPID PANEL
LDL Cholesterol: 103 mg/dL — ABNORMAL HIGH (ref 0–99)
Triglycerides: 108 mg/dL (ref <150)
VLDL: 22 mg/dL (ref 0–40)

## 2012-11-23 MED ORDER — NORGESTIM-ETH ESTRAD TRIPHASIC 0.18/0.215/0.25 MG-25 MCG PO TABS: 1.0000 | ORAL_TABLET | Freq: Every day | ORAL | Status: DC

## 2012-11-23 NOTE — Patient Instructions (Addendum)
Keep up a regular exercise program and make sure you are eating a healthy diet Try to eat 4 servings of dairy a day, or if you are lactose intolerant take a calcium with vitamin D daily.  Your vaccines are up to date.   

## 2012-11-23 NOTE — Progress Notes (Signed)
Subjective:     Tammy Hoffman is a 42 y.o. female and is here for a comprehensive physical exam. The patient reports problems - Says her allergies have been flaring. she worries there may be some mold in her home but not sure.  Had allergy testing years ago.  .She is using claritin and helps some.  Says make her really tired. She feels like she can smell a little bit of mold in her bedroom. She was allergy tested about 10 years ago and says everything was normal. She was just diagnosed with rhinitis.  History   Social History  . Marital Status: Married    Spouse Name: Molli Hazard    Number of Children: 2  . Years of Education: MBA   Occupational History  . computer analyst     self employed   Social History Main Topics  . Smoking status: Former Smoker    Quit date: 09/15/1990  . Smokeless tobacco: Not on file  . Alcohol Use: Yes  . Drug Use: No  . Sexually Active: Not on file   Other Topics Concern  . Not on file   Social History Narrative   Regular exercise.  2 caffeinated drinks per day.     Health Maintenance  Topic Date Due  . Influenza Vaccine  05/16/2013  . Pap Smear  01/01/2015  . Tetanus/tdap  09/16/2019    The following portions of the patient's history were reviewed and updated as appropriate: allergies, current medications, past family history, past medical history, past social history, past surgical history and problem list.  Review of Systems A comprehensive review of systems was negative.   Objective:    BP 129/79  Pulse 65  Ht 5\' 11"  (1.803 m)  Wt 171 lb (77.565 kg)  BMI 23.86 kg/m2  LMP 11/09/2012 General appearance: alert, cooperative and appears stated age Head: Normocephalic, without obvious abnormality, atraumatic Eyes: conj clear, EOMi, PEERLA Ears: normal TM's and external ear canals both ears Nose: Nares normal. Septum midline. Mucosa normal. No drainage or sinus tenderness. Throat: lips, mucosa, and tongue normal; teeth and gums normal Neck:  no adenopathy, no carotid bruit, no JVD, supple, symmetrical, trachea midline and thyroid not enlarged, symmetric, no tenderness/mass/nodules Back: symmetric, no curvature. ROM normal. No CVA tenderness. Lungs: clear to auscultation bilaterally Breasts: normal appearance, no masses or tenderness Heart: regular rate and rhythm, S1, S2 normal, no murmur, click, rub or gallop Abdomen: soft, non-tender; bowel sounds normal; no masses,  no organomegaly Extremities: extremities normal, atraumatic, no cyanosis or edema Pulses: 2+ and symmetric Skin: Skin color, texture, turgor normal. No rashes or lesions Lymph nodes: Cervical, supraclavicular, and axillary nodes normal. Neurologic: Alert and oriented X 3, normal strength and tone. Normal symmetric reflexes. Normal coordination and gait    Assessment:    Healthy female exam.    Plan:     See After Visit Summary for Counseling Recommendations  Keep up a regular exercise program and make sure you are eating a healthy diet Try to eat 4 servings of dairy a day, or if you are lactose intolerant take a calcium with vitamin D daily.  Your vaccines are up to date.   Allergic rhinitis-she could consider changing from Claritin to Zyrtec or Allegra. If she chooses to the Zyrtec then recommend doing it at bedtime. Also discussed that she may want to consider hiring a company to come into her home to test for mold. I think this would be reasonable. We can also consider retesting her  for allergies. Recommend either her blood testing or the skin prick testing. She says she would like to do blood tests to see if she may have developed some allergies. We will do an region 2 panel which covers some grasses, trees, dust mite, mold, cat and dog. If her symptoms don't improve and consider referral to allergist for further evaluation.

## 2012-11-24 LAB — ALLERGY PROFILE REGION II-DC, DE, MD, ~~LOC~~, VA
Allergen, D pternoyssinus,d7: <0.1 kU/L
Alternaria Alternata: <0.1 kU/L
Aspergillus fumigatus, m3: <0.1 kU/L
Box Elder IgE: <0.1 kU/L
Cat Dander: <0.1 kU/L
Cockroach: <0.1 kU/L
Elm IgE: <0.1 kU/L
IgE (Immunoglobulin E), Serum: <1.5 IU/mL (ref 0.0–180.0)
Johnson Grass: <0.1 kU/L

## 2013-01-05 ENCOUNTER — Other Ambulatory Visit: Payer: Self-pay | Admitting: Family Medicine

## 2013-01-05 DIAGNOSIS — Z1231 Encounter for screening mammogram for malignant neoplasm of breast: Secondary | ICD-10-CM

## 2013-01-11 ENCOUNTER — Ambulatory Visit (INDEPENDENT_AMBULATORY_CARE_PROVIDER_SITE_OTHER): Payer: BC Managed Care – PPO

## 2013-01-11 DIAGNOSIS — Z1231 Encounter for screening mammogram for malignant neoplasm of breast: Secondary | ICD-10-CM

## 2013-07-11 ENCOUNTER — Ambulatory Visit: Payer: BC Managed Care – PPO | Admitting: Family Medicine

## 2013-07-13 ENCOUNTER — Encounter: Payer: Self-pay | Admitting: Family Medicine

## 2013-07-13 ENCOUNTER — Ambulatory Visit (INDEPENDENT_AMBULATORY_CARE_PROVIDER_SITE_OTHER): Payer: BC Managed Care – PPO | Admitting: Family Medicine

## 2013-07-13 VITALS — BP 167/90 | HR 85 | Ht 71.0 in | Wt 169.0 lb

## 2013-07-13 DIAGNOSIS — M25551 Pain in right hip: Secondary | ICD-10-CM

## 2013-07-13 DIAGNOSIS — M25559 Pain in unspecified hip: Secondary | ICD-10-CM

## 2013-07-13 MED ORDER — PREDNISONE (PAK) 10 MG PO TABS: ORAL_TABLET | ORAL | Status: DC

## 2013-07-13 NOTE — Patient Instructions (Signed)
You have piriformis syndrome, less likely a pinched nerve in your back. Start physical therapy and do home exercises on days you don't go to therapy. Take prednisone dose pack for 6 days as directed. After this you can go back to taking ibuprofen 600mg  three times a day with food OR aleve 2 tabs twice a day with food. Tennis ball massage to the area. Biofreeze as needed. Follow up with me in 6 weeks for reevaluation.

## 2013-07-19 ENCOUNTER — Encounter: Payer: Self-pay | Admitting: Family Medicine

## 2013-07-19 NOTE — Assessment & Plan Note (Signed)
consistent with piriformis syndrome, less likely radiculopathy.  Start with prednisone, physical therapy and home exercise program.  Tennis ball massage, nsaids, biofreeze.  F/u in 6 weeks for reevaluation.

## 2013-07-19 NOTE — Progress Notes (Signed)
Patient ID: Tammy Hoffman, female   DOB: [birth year], 42 y.o.   MRN: [id number]  PCP: Nani Gasser, MD  Subjective:   HPI: Patient is a 42 y.o. female here for right hip, back pain.  Patient reports her back has always felt tight that she can remember. Recalls getting a gluteus massage that really worsened her low back and right hip pain recently. Has been icing, trying to stay active. Difficulty riding in a car due to pain. Up until 2 months ago would come and go but now has been constant. Will throb after exercise and radiate down to her knee. Has been seeing a chiropractor for this - initial improvement but not so much anymore. No bowel/bladder dysfunction.  Past Medical History  Diagnosis Date  . Miscarriage   . Allergy   . Asthma   . HPV in female     Current Outpatient Prescriptions on File Prior to Visit  Medication Sig Dispense Refill  . albuterol (PROAIR HFA) 108 (90 BASE) MCG/ACT inhaler Inhale 2 puffs into the lungs every 6 (six) hours as needed for wheezing.  3 Inhaler  1  . loratadine (CLARITIN) 10 MG tablet Take 10 mg by mouth daily.        . Norgestimate-Ethinyl Estradiol Triphasic (ORTHO TRI-CYCLEN LO) 0.18/0.215/0.25 MG-25 MCG tab Take 1 tablet by mouth daily.  3 Package  3   No current facility-administered medications on file prior to visit.    Past Surgical History  Procedure Laterality Date  . Basal cell carcinoma excision  12/2008    Left eye  . Colonoscopy  09/15/2005    No Known Allergies  History   Social History  . Marital Status: Married    Spouse Name: Molli Hazard    Number of Children: 2  . Years of Education: MBA   Occupational History  . computer analyst     self employed   Social History Main Topics  . Smoking status: Former Smoker    Quit date: 09/15/1990  . Smokeless tobacco: Not on file  . Alcohol Use: Yes  . Drug Use: No  . Sexual Activity: Not on file   Other Topics Concern  . Not on file   Social History Narrative   Regular exercise.  2 caffeinated drinks per day.      Family History  Problem Relation Age of Onset  . Diabetes      GM  . Hyperlipidemia Father   . Hypertension Father   . Colon cancer Mother 93  . Diabetes Maternal Uncle   . Diabetes Maternal Grandmother   . Heart attack Maternal Grandfather   . Hyperlipidemia Paternal Grandmother   . Hypertension Paternal Grandmother     BP 167/90  Pulse 85  Ht 5\' 11"  (1.803 m)  Wt 169 lb (76.658 kg)  BMI 23.58 kg/m2  Review of Systems: See HPI above.    Objective:  Physical Exam:  Gen: NAD  Back/R hip: No gross deformity, scoliosis. TTP right buttock, over piriformis, less paraspinal region.  No midline or bony TTP. FROM. Strength LEs 5/5 all muscle groups with 5-/5 hip abduction. 2+ MSRs in patellar and achilles tendons, equal bilaterally. Negative SLRs. Sensation intact to light touch bilaterally. Negative logroll bilateral hips Mild positive piriformis on right.  Negative left, negative fabers.      Assessment & Plan:  1. Right hip pain - consistent with piriformis syndrome, less likely radiculopathy.  Start with prednisone, physical therapy and home exercise program.  Tennis ball massage, nsaids, biofreeze.  F/u in 6 weeks for reevaluation.

## 2013-07-21 ENCOUNTER — Other Ambulatory Visit: Payer: Self-pay

## 2013-08-31 ENCOUNTER — Telehealth: Payer: Self-pay | Admitting: *Deleted

## 2013-08-31 NOTE — Telephone Encounter (Signed)
Pt stated that she had taken her sons to the Dr and they were given abx. And she was starting to feel run down and wanted some advice since she is going to be going out of town for the holidays. She denies any HA, sinus, fever, or earache/pain. I told her that she was likely just overwhelmed with the holidays and that she probably has neglected staying hydrated and eating well and resting. I told her that she should try drinking more water, try Vit C and eating and resting. She did state that she had taken a nap earlier and felt better. I told her that I would route this to Dr. Linford Arnold and if she wasn't feeling any better to please call back to schedule an appt. Pt voiced understanding and agreed .Loralee Pacas [place]

## 2013-09-01 NOTE — Telephone Encounter (Signed)
lvm informing pt of recommendations. .Tammy Hoffman Lynetta  

## 2013-09-01 NOTE — Telephone Encounter (Signed)
Agree, If feels staring to come down with something, Sorethroat, etc then start zinc lozenges every 4 hours. It has been shown to shorten the duration of the illness

## 2014-01-11 ENCOUNTER — Other Ambulatory Visit: Payer: Self-pay | Admitting: Family Medicine

## 2014-01-11 DIAGNOSIS — Z1231 Encounter for screening mammogram for malignant neoplasm of breast: Secondary | ICD-10-CM

## 2014-01-18 ENCOUNTER — Encounter: Payer: Self-pay | Admitting: Family Medicine

## 2014-01-18 ENCOUNTER — Ambulatory Visit (INDEPENDENT_AMBULATORY_CARE_PROVIDER_SITE_OTHER): Payer: BC Managed Care – PPO | Admitting: Family Medicine

## 2014-01-18 VITALS — BP 127/86 | HR 64 | Wt 171.0 lb

## 2014-01-18 DIAGNOSIS — M25559 Pain in unspecified hip: Secondary | ICD-10-CM

## 2014-01-18 DIAGNOSIS — M25551 Pain in right hip: Secondary | ICD-10-CM

## 2014-01-18 DIAGNOSIS — Z0000 Encounter for general adult medical examination without abnormal findings: Secondary | ICD-10-CM

## 2014-01-18 MED ORDER — SCOPOLAMINE 1 MG/3DAYS TD PT72: 1.0000 | MEDICATED_PATCH | TRANSDERMAL | Status: DC

## 2014-01-18 MED ORDER — NORGESTIM-ETH ESTRAD TRIPHASIC 0.18/0.215/0.25 MG-25 MCG PO TABS: 1.0000 | ORAL_TABLET | Freq: Every day | ORAL | Status: AC

## 2014-01-18 MED ORDER — ALBUTEROL SULFATE HFA 108 (90 BASE) MCG/ACT IN AERS
2.0000 | INHALATION_SPRAY | Freq: Four times a day (QID) | RESPIRATORY_TRACT | Status: AC | PRN
Start: 2014-01-18 — End: 2015-01-18

## 2014-01-18 NOTE — Progress Notes (Signed)
Subjective:     Tammy Hoffman is a 43 y.o. female and is here for a comprehensive physical exam. The patient reports no problems. She and her husband split up so she quit taking the birth control period. She has 2 periods in one month. Would like to restart it.    Using her albuterol before exercise at the gym.  Has not had to use it otherwise. Says it has really helped her  Wants a sea-sick patch as she is going on a cruise. 7 days cruise.   History   Social History  . Marital Status: Married    Spouse Name: Molli HazardMatthew    Number of Children: 2  . Years of Education: MBA   Occupational History  . computer analyst     self employed   Social History Main Topics  . Smoking status: Former Smoker    Quit date: 09/15/1990  . Smokeless tobacco: Not on file  . Alcohol Use: Yes  . Drug Use: No  . Sexual Activity: Not on file   Other Topics Concern  . Not on file   Social History Narrative   Regular exercise.  2 caffeinated drinks per day.     Health Maintenance  Topic Date Due  . Influenza Vaccine  04/15/2014  . Pap Smear  01/01/2015  . Tetanus/tdap  09/16/2019    The following portions of the patient's history were reviewed and updated as appropriate: allergies, current medications, past family history, past medical history, past social history, past surgical history and problem list.  Review of Systems A comprehensive review of systems was negative.   Objective:    BP 127/86  Pulse 64  Wt 171 lb (77.565 kg) General appearance: alert, cooperative and appears stated age Head: Normocephalic, without obvious abnormality, atraumatic Eyes: conj clear, EOMI, PEERLA Ears: normal TM's and external ear canals both ears Nose: Nares normal. Septum midline. Mucosa normal. No drainage or sinus tenderness. Throat: lips, mucosa, and tongue normal; teeth and gums normal Neck: no adenopathy, no carotid bruit, no JVD, supple, symmetrical, trachea midline and thyroid not enlarged,  symmetric, no tenderness/mass/nodules Back: symmetric, no curvature. ROM normal. No CVA tenderness. Lungs: clear to auscultation bilaterally Breasts: normal appearance, no masses or tenderness Heart: regular rate and rhythm, S1, S2 normal, no murmur, click, rub or gallop Abdomen: soft, non-tender; bowel sounds normal; no masses,  no organomegaly Extremities: extremities normal, atraumatic, no cyanosis or edema Pulses: 2+ and symmetric Skin: Skin color, texture, turgor normal. No rashes or lesions Lymph nodes: Cervical, supraclavicular, and axillary nodes normal. Neurologic: Alert and oriented X 3, normal strength and tone. Normal symmetric reflexes. Normal coordination and gait    Assessment:    Healthy female exam.     Plan:     See After Visit Summary for Counseling Recommendations  complete physical examination Keep up a regular exercise program and make sure you are eating a healthy diet Try to eat 4 servings of dairy a day, or if you are lactose intolerant take a calcium with vitamin D daily.  Your vaccines are up to date.  She has her mammogram scheduled. Due for Pap smear next year. Due for blood work today. Refilled her birth control.   Sent rx for transderm-scop patch.    She's also had problems with her piriformis/SI joint since before October. She is seeing a sports medicine as well as an orthopedist. She's also had an MRI. She did get relief with an injection of piriformis for about one month.  But her pain persists. She's also completed 28 weeks of physical therapy and still has pain when she exercises or works out. She is to the gym pretty regularly and does a lot of stretching and yoga. Recommend referral to Dr. Benjamin Stainhekkekandam for further evaluation and treatment.

## 2014-01-18 NOTE — Patient Instructions (Signed)
Keep up a regular exercise program and make sure you are eating a healthy diet Try to eat 4 servings of dairy a day, or if you are lactose intolerant take a calcium with vitamin D daily.  Your vaccines are up to date.   

## 2014-01-24 ENCOUNTER — Ambulatory Visit (INDEPENDENT_AMBULATORY_CARE_PROVIDER_SITE_OTHER): Payer: BC Managed Care – PPO

## 2014-01-24 DIAGNOSIS — Z1231 Encounter for screening mammogram for malignant neoplasm of breast: Secondary | ICD-10-CM

## 2014-01-25 LAB — COMPLETE METABOLIC PANEL WITH GFR
ALBUMIN: 4.2 g/dL (ref 3.5–5.2)
ALK PHOS: 38 U/L — AB (ref 39–117)
ALT: 16 U/L (ref 0–35)
AST: 19 U/L (ref 0–37)
BILIRUBIN TOTAL: 0.8 mg/dL (ref 0.2–1.2)
BUN: 16 mg/dL (ref 6–23)
CO2: 26 mEq/L (ref 19–32)
CREATININE: 0.82 mg/dL (ref 0.50–1.10)
Calcium: 9.3 mg/dL (ref 8.4–10.5)
Chloride: 104 mEq/L (ref 96–112)
GFR, EST NON AFRICAN AMERICAN: 88 mL/min
GFR, Est African American: >89 mL/min
Glucose, Bld: 83 mg/dL (ref 70–99)
POTASSIUM: 4.3 meq/L (ref 3.5–5.3)
Sodium: 138 mEq/L (ref 135–145)
Total Protein: 6.5 g/dL (ref 6.0–8.3)

## 2014-01-25 LAB — LIPID PANEL
CHOL/HDL RATIO: 2.2 ratio
CHOLESTEROL: 157 mg/dL (ref 0–200)
HDL: 70 mg/dL (ref >39)
LDL Cholesterol: 75 mg/dL (ref 0–99)
TRIGLYCERIDES: 58 mg/dL (ref <150)
VLDL: 12 mg/dL (ref 0–40)

## 2014-01-25 NOTE — Progress Notes (Signed)
Quick Note:  All labs are normal. ______ 

## 2014-01-30 ENCOUNTER — Telehealth: Payer: Self-pay | Admitting: *Deleted

## 2014-01-30 ENCOUNTER — Ambulatory Visit (INDEPENDENT_AMBULATORY_CARE_PROVIDER_SITE_OTHER): Payer: BC Managed Care – PPO | Admitting: Sports Medicine

## 2014-01-30 ENCOUNTER — Ambulatory Visit (INDEPENDENT_AMBULATORY_CARE_PROVIDER_SITE_OTHER): Payer: BC Managed Care – PPO

## 2014-01-30 ENCOUNTER — Encounter: Payer: Self-pay | Admitting: Sports Medicine

## 2014-01-30 VITALS — BP 126/82 | HR 68 | Ht 71.0 in | Wt 173.0 lb

## 2014-01-30 DIAGNOSIS — R935 Abnormal findings on diagnostic imaging of other abdominal regions, including retroperitoneum: Secondary | ICD-10-CM

## 2014-01-30 DIAGNOSIS — M25551 Pain in right hip: Secondary | ICD-10-CM

## 2014-01-30 DIAGNOSIS — M545 Low back pain, unspecified: Secondary | ICD-10-CM

## 2014-01-30 DIAGNOSIS — M25559 Pain in unspecified hip: Secondary | ICD-10-CM

## 2014-01-30 NOTE — Progress Notes (Signed)
   Subjective:    I'm seeing this patient as a consultation for: Dr. Linford ArnoldMetheney   CC: Right hip pain  HPI: This is a very pleasant 43 year old female with a very extensive history of a workup for right-sided buttock and groin pain. She describes the pain as in the groin, present every day, worse with hip flexion weightbearing, with radiation down to the anterolateral knee. She describes the sensation as as shots of pain radiating down to the knee. 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 Symptoms are moderate, persistent.  Past medical history, Surgical history, Family history not pertinant except as noted below, Social history, Allergies, and medications have been entered into the medical record, reviewed, and no changes needed.   Review of Systems: No headache, visual changes, nausea, vomiting, diarrhea, constipation, dizziness, abdominal pain, skin rash, fevers, chills, night sweats, weight loss, swollen lymph nodes, body aches, joint swelling, muscle aches, chest pain, shortness of breath, mood changes, visual or auditory hallucinations.   Objective:   General: Well Developed, well nourished, and in no acute distress.  Neuro/Psych: Alert and oriented x3, extra-ocular muscles intact, able to move all 4 extremities, sensation grossly intact. Skin: Warm and dry, no rashes noted.  Respiratory: Not using accessory muscles, speaking in full sentences, trachea midline.  Cardiovascular: Pulses palpable, no extremity edema. Abdomen: Does not appear distended. Back Exam:  Inspection: Unremarkable  Motion: Flexion 45 deg, Extension 45 deg, Side Bending to 45 deg bilaterally,  Rotation to 45 deg bilaterally  SLR laying: Negative  XSLR laying: Negative  Palpable tenderness: None. FABER: negative. Sensory  change: Gross sensation intact to all lumbar and sacral dermatomes.  Reflexes: 2+ at both patellar tendons, 2+ at achilles tendons, Babinski's downgoing.  Strength at foot  Plantar-flexion: 5/5 Dorsi-flexion: 5/5 Eversion: 5/5 Inversion: 5/5  Leg strength  Quad: 5/5 Hamstring: 5/5 Hip flexor: 5/5 Hip abductors: 5/5  Gait unremarkable. Right Hip: ROM IR: 45 Deg, ER: 45 Deg, Flexion: 120 Deg, Extension: 100 Deg, Abduction: 45 Deg, Adduction: 45 Deg, there is reproduction of pain with internal rotation, pain is localized in the groin. Strength IR: 5/5, ER: 5/5, Flexion: 5/5, Extension: 5/5, Abduction: 5/5, Adduction: 5/5 Pelvic alignment unremarkable to inspection and palpation. Standing hip rotation and gait without trendelenburg sign / unsteadiness. Greater trochanter without tenderness to palpation. No tenderness over piriformis. No pain with FABER or FADIR. No SI joint tenderness and normal minimal SI movement.  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 This is worse at the L4-L5 level.  Impression and Recommendations:   This case required medical decision making of moderate complexity.

## 2014-01-30 NOTE — Telephone Encounter (Signed)
PA obtained for MRI Hip RT w/contrast.  Auth # [id number]. Exp. 02/28/14.  Meyer CoryMisty Ahmad, LPN

## 2014-01-30 NOTE — Assessment & Plan Note (Addendum)
Symptoms today are referrable to the right femoral acetabular joint. I concerned about her hip labral tear. She has had physical therapy for several months, steroids, NSAIDs, muscle relaxers. Nothing has helped. She did have a piriformis muscle injection which provided temporary relief but not immediate. L-spine MRI does show L3-4 and L4-5 disc protrusions that may effect the exiting nerve roots at these levels, however symptoms are not classically radicular. At this point I do think we need to proceed with an x-ray and then a femoral acetabular joint MR arthrogram. Return to see me for MR injection.

## 2014-02-08 ENCOUNTER — Ambulatory Visit: Payer: BC Managed Care – PPO | Admitting: Sports Medicine

## 2014-02-22 ENCOUNTER — Ambulatory Visit (INDEPENDENT_AMBULATORY_CARE_PROVIDER_SITE_OTHER): Payer: BC Managed Care – PPO

## 2014-02-22 ENCOUNTER — Ambulatory Visit (INDEPENDENT_AMBULATORY_CARE_PROVIDER_SITE_OTHER): Payer: BC Managed Care – PPO | Admitting: Sports Medicine

## 2014-02-22 ENCOUNTER — Encounter: Payer: Self-pay | Admitting: Sports Medicine

## 2014-02-22 VITALS — BP 126/77 | HR 67 | Ht 71.0 in | Wt 169.0 lb

## 2014-02-22 DIAGNOSIS — M25551 Pain in right hip: Secondary | ICD-10-CM

## 2014-02-22 DIAGNOSIS — M24159 Other articular cartilage disorders, unspecified hip: Secondary | ICD-10-CM

## 2014-02-22 DIAGNOSIS — M25559 Pain in unspecified hip: Secondary | ICD-10-CM

## 2014-02-22 MED ORDER — GADOBENATE DIMEGLUMINE 529 MG/ML IV SOLN: 5.0000 mL | Freq: Once | INTRAVENOUS | Status: AC | PRN

## 2014-02-22 NOTE — Assessment & Plan Note (Signed)
In the past there has been a great deal of difficulty finding her pain generator. She certainly has disc protrusions at the L3-4 and L4-5 level affecting the exiting nerve roots, but symptoms were not classically radicular. She also had a piriformis injection provided temporary pain relief but not instantaneously suggesting that the piriformis is not the pain generator. Symptoms for me are more referral to the hip joint, injection for him or arthrogram performed today. I will see her next week to go for MRI results.

## 2014-02-22 NOTE — Progress Notes (Signed)
Procedure: Real-time Ultrasound Guided gadolinium contrast injection of right femoral acetabular joint Device: GE Logiq E  Verbal informed consent obtained.  Time-out conducted.  Noted no overlying erythema, induration, or other signs of local infection.  Skin prepped in a sterile fashion.  Local anesthesia: Topical Ethyl chloride.  With sterile technique and under real time ultrasound guidance:  Spinal needle advanced the femoral head/neck junction, bone contacted, 2 cc Kenalog 40, 4 cc lidocaine injected easily, syringe switched and 0.1 cc of gadolinium was injected, syringe again switched and 5 cc of sterile saline was injected. Joint visualized and capsule seen distending confirming intra-articular placement of contrast material and medication. Completed without difficulty  Advised to call if fevers/chills, erythema, induration, drainage, or persistent bleeding.  Images permanently stored and available for review in the ultrasound unit.  Impression: Technically successful ultrasound guided gadolinium contrast injection for MR arthrography.  Please see separate MR arthrogram report.

## 2014-02-27 ENCOUNTER — Ambulatory Visit (INDEPENDENT_AMBULATORY_CARE_PROVIDER_SITE_OTHER): Payer: BC Managed Care – PPO | Admitting: Sports Medicine

## 2014-02-27 ENCOUNTER — Encounter: Payer: Self-pay | Admitting: Sports Medicine

## 2014-02-27 VITALS — BP 113/75 | HR 78 | Ht 71.0 in | Wt 166.0 lb

## 2014-02-27 DIAGNOSIS — M24159 Other articular cartilage disorders, unspecified hip: Secondary | ICD-10-CM

## 2014-02-27 DIAGNOSIS — M545 Low back pain, unspecified: Secondary | ICD-10-CM

## 2014-02-27 DIAGNOSIS — M1610 Unilateral primary osteoarthritis, unspecified hip: Secondary | ICD-10-CM

## 2014-02-27 DIAGNOSIS — M169 Osteoarthritis of hip, unspecified: Secondary | ICD-10-CM

## 2014-02-27 NOTE — Assessment & Plan Note (Signed)
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 Return in a month, if continues to have low back pain, we can further evaluate the lumbar spine.

## 2014-02-27 NOTE — Progress Notes (Signed)
  Subjective:    CC: MRI results  HPI: Tammy Hoffman comes back she's had persistent pain in the right hip for some time now, undiagnosed, more recently we did a MR arthrogram of the right hip with injection of steroid in addition to gadolinium. She returns essentially pain-free, MRI did show a labral tear.   Low back pain: with right-sided radiculopathy, orders if we can look at this at some point.  Past medical history, Surgical history, Family history not pertinant except as noted below, Social history, Allergies, and medications have been entered into the medical record, reviewed, and no changes needed.   Review of Systems: No fevers, chills, night sweats, weight loss, chest pain, or shortness of breath.   Objective:    General: Well Developed, well nourished, and in no acute distress.  Neuro: Alert and oriented x3, extra-ocular muscles intact, sensation grossly intact.  HEENT: Normocephalic, atraumatic, pupils equal round reactive to light, neck supple, no masses, no lymphadenopathy, thyroid nonpalpable.  Skin: Warm and dry, no rashes. Cardiac: Regular rate and rhythm, no murmurs rubs or gallops, no lower extremity edema.  Respiratory: Clear to auscultation bilaterally. Not using accessory muscles, speaking in full sentences.   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  Impression and Recommendations:

## 2014-02-27 NOTE — Assessment & Plan Note (Signed)
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 Symptoms have not surprisingly improved as we did inject some triamcinolone as well. Return in a month.

## 2014-03-27 ENCOUNTER — Encounter: Payer: Self-pay | Admitting: Sports Medicine

## 2014-03-27 ENCOUNTER — Ambulatory Visit (INDEPENDENT_AMBULATORY_CARE_PROVIDER_SITE_OTHER): Payer: BC Managed Care – PPO | Admitting: Sports Medicine

## 2014-03-27 VITALS — BP 130/85 | HR 70 | Ht 71.0 in | Wt 169.0 lb

## 2014-03-27 DIAGNOSIS — M1610 Unilateral primary osteoarthritis, unspecified hip: Secondary | ICD-10-CM

## 2014-03-27 DIAGNOSIS — M5441 Lumbago with sciatica, right side: Secondary | ICD-10-CM

## 2014-03-27 DIAGNOSIS — M5430 Sciatica, unspecified side: Secondary | ICD-10-CM

## 2014-03-27 DIAGNOSIS — M24159 Other articular cartilage disorders, unspecified hip: Secondary | ICD-10-CM

## 2014-03-27 DIAGNOSIS — M169 Osteoarthritis of hip, unspecified: Secondary | ICD-10-CM

## 2014-03-27 NOTE — Progress Notes (Addendum)
  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  Low back pain colon with right-sided radiation to the lateral thigh but not past the knee, overall extremely mild, does not even take a daily meloxicam.  Past medical history, Surgical history, Family history not pertinant except as noted below, Social history, Allergies, and medications have been entered into the medical record, reviewed, and no changes needed.   Review of Systems: No fevers, chills, night sweats, weight loss, chest pain, or shortness of breath.   Objective:    General: Well Developed, well nourished, and in no acute distress.  Neuro: Alert and oriented x3, extra-ocular muscles intact, sensation grossly intact.  HEENT: Normocephalic, atraumatic, pupils equal round reactive to light, neck supple, no masses, no lymphadenopathy, thyroid nonpalpable.  Skin: Warm and dry, no rashes. Cardiac: Regular rate and rhythm, no murmurs rubs or gallops, no lower extremity edema.  Respiratory: Clear to auscultation bilaterally. Not using accessory muscles, speaking in full sentences. Right Hip: ROM IR: 60 Deg, ER: 60 Deg, Flexion: 120 Deg, Extension: 100 Deg, Abduction: 45 Deg, Adduction: 45 Deg Strength IR: 5/5, ER: 5/5, Flexion: 5/5, Extension: 5/5, Abduction: 5/5, Adduction: 5/5 Pelvic alignment unremarkable to inspection and palpation. Standing hip rotation and gait without trendelenburg / unsteadiness. Greater trochanter without tenderness to palpation. No tenderness over piriformis. No SI joint tenderness and normal minimal SI movement. Back Exam:  Inspection: Unremarkable  Motion: Flexion 45 deg, Extension 45 deg, Side Bending to 45 deg bilaterally,  Rotation to 45 deg bilaterally  SLR laying: Negative  XSLR laying: Negative  Palpable tenderness: None. FABER: negative. Sensory change: Gross sensation intact to all lumbar and sacral dermatomes.    Reflexes: 2+ at both patellar tendons, 2+ at achilles tendons, Babinski's downgoing.  Strength at foot  Plantar-flexion: 5/5 Dorsi-flexion: 5/5 Eversion: 5/5 Inversion: 5/5  Leg strength  Quad: 5/5 Hamstring: 5/5 Hip flexor: 5/5 Hip abductors: 5/5  Gait unremarkable.  Impression and Recommendations:

## 2014-03-27 NOTE — Assessment & Plan Note (Signed)
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 Continue as needed meloxicam, symptoms are very mild. No further intervention.

## 2014-03-27 NOTE — Assessment & Plan Note (Signed)
Continues to do well after MR arthrogram with intra-articular steroids. At this point she can come back as needed, I am happy to do another injection up to 4 times per year. Next it would be hip arthroscopy.

## 2014-05-31 ENCOUNTER — Encounter: Payer: Self-pay | Admitting: Sports Medicine

## 2014-05-31 ENCOUNTER — Ambulatory Visit (INDEPENDENT_AMBULATORY_CARE_PROVIDER_SITE_OTHER): Payer: BC Managed Care – PPO | Admitting: Sports Medicine

## 2014-05-31 VITALS — BP 138/82 | HR 68 | Ht 71.0 in | Wt 167.0 lb

## 2014-05-31 DIAGNOSIS — M1610 Unilateral primary osteoarthritis, unspecified hip: Secondary | ICD-10-CM | POA: Diagnosis not present

## 2014-05-31 DIAGNOSIS — M24159 Other articular cartilage disorders, unspecified hip: Secondary | ICD-10-CM

## 2014-05-31 DIAGNOSIS — M169 Osteoarthritis of hip, unspecified: Secondary | ICD-10-CM

## 2014-05-31 MED ORDER — MELOXICAM 15 MG PO TABS: 15.0000 mg | ORAL_TABLET | Freq: Every day | ORAL | Status: AC

## 2014-05-31 NOTE — Assessment & Plan Note (Signed)
Patient has been doing well with occasional hip joint injections. The most recent of which was 3-1/2 months ago. She does desire a repeat femoral acetabular joint injection today.

## 2014-05-31 NOTE — Progress Notes (Signed)
  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 We injected steroid during the arthrogram so she had a fantastic response and her last injection was 3-1/2 months ago although pain returned just over 2 months ago. She desires repeat interventional treatment today. Pain is moderate, persistent, localized in the groin without mechanical symptoms.  Past medical history, Surgical history, Family history not pertinant except as noted below, Social history, Allergies, and medications have been entered into the medical record, reviewed, and no changes needed.   Review of Systems: No fevers, chills, night sweats, weight loss, chest pain, or shortness of breath.   Objective:    General: Well Developed, well nourished, and in no acute distress.  Neuro: Alert and oriented x3, extra-ocular muscles intact, sensation grossly intact.  HEENT: Normocephalic, atraumatic, pupils equal round reactive to light, neck supple, no masses, no lymphadenopathy, thyroid nonpalpable.  Skin: Warm and dry, no rashes. Cardiac: Regular rate and rhythm, no murmurs rubs or gallops, no lower extremity edema.  Respiratory: Clear to auscultation bilaterally. Not using accessory muscles, speaking in full sentences.  Procedure: Real-time Ultrasound Guided Injection of right hip Device: GE Logiq E  Verbal informed consent obtained.  Time-out conducted.  Noted no overlying erythema, induration, or other signs of local infection.  Skin prepped in a sterile fashion.  Local anesthesia: Topical Ethyl chloride.  With sterile technique and under real time ultrasound guidance:  22-gauge spinal needle advanced to the femoral head/neck junction, 2 cc kenalog 40, 4 cc lidocaine injected easily. Completed without difficulty  Pain immediately resolved suggesting accurate placement of the medication.  Advised to call if fevers/chills,  erythema, induration, drainage, or persistent bleeding.  Images permanently stored and available for review in the ultrasound unit.  Impression: Technically successful ultrasound guided injection.  Impression and Recommendations:

## 2014-09-24 IMAGING — CR DG HIP COMPLETE 2+V*R*
3 series · 3 of 3 positions shown · non-contrast
Comparison: None.

CLINICAL DATA: Low back pain radiating to the right hip for 9
months

EXAM:
RIGHT HIP - COMPLETE 2+ VIEW

[view not recorded (1 of 3)]
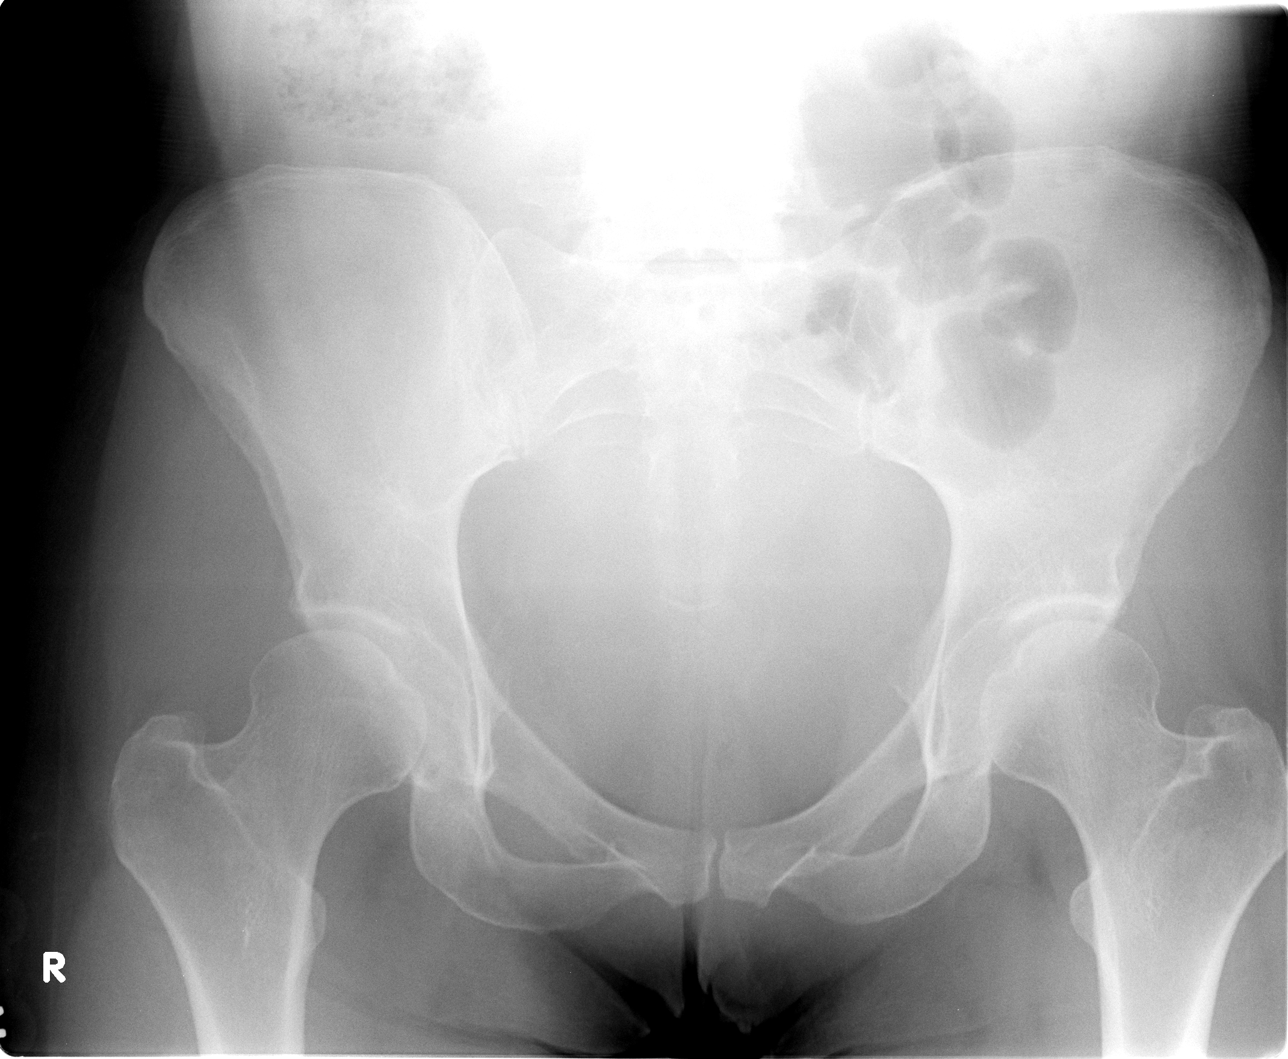

[view not recorded (2 of 3)]
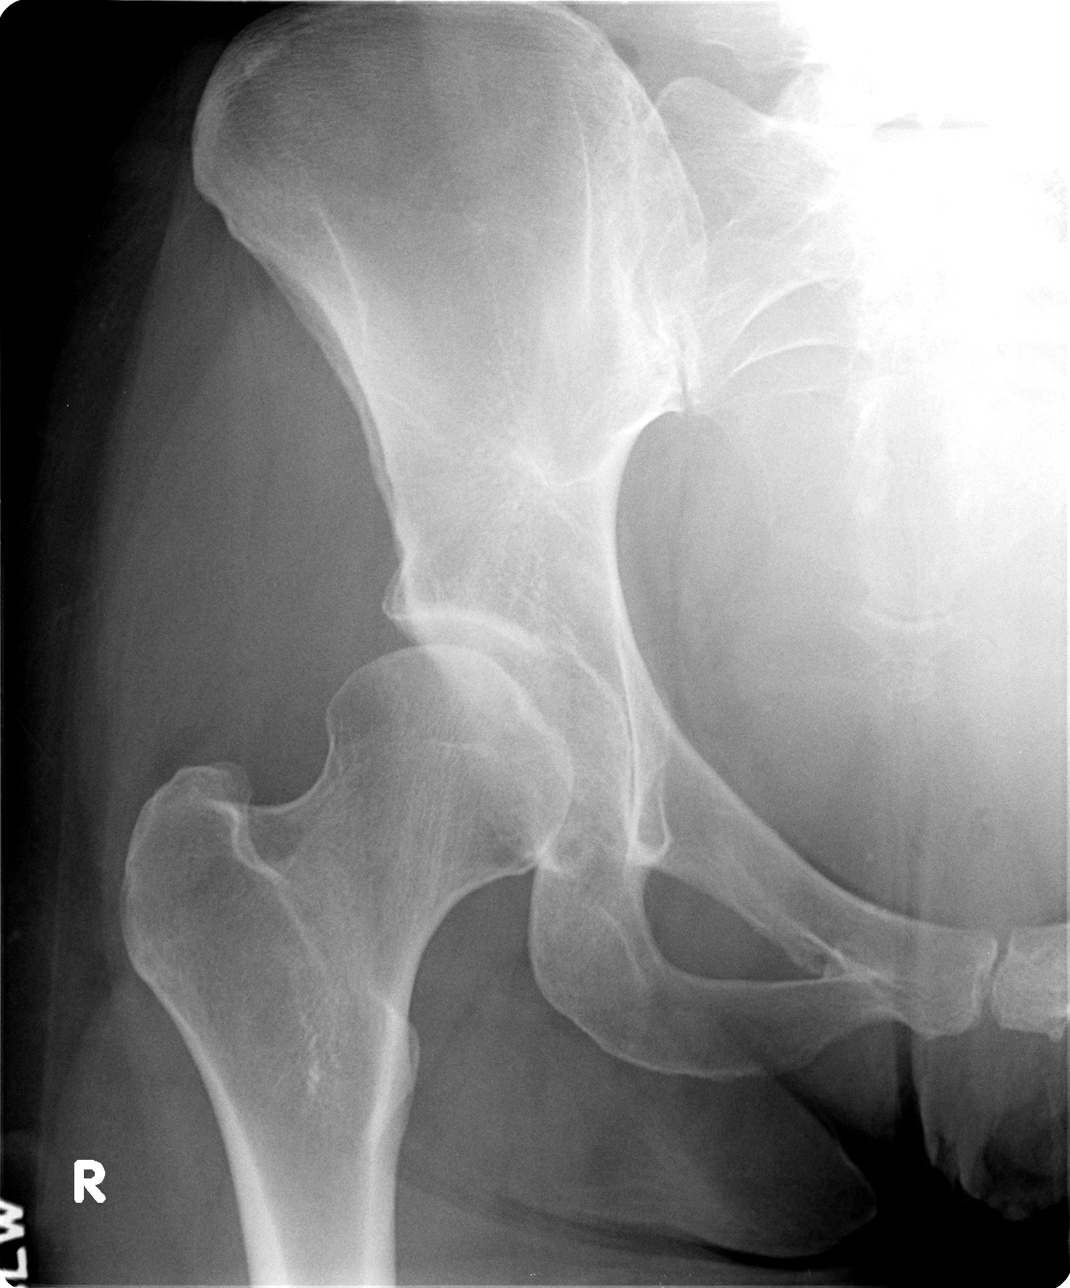

[view not recorded (3 of 3)]
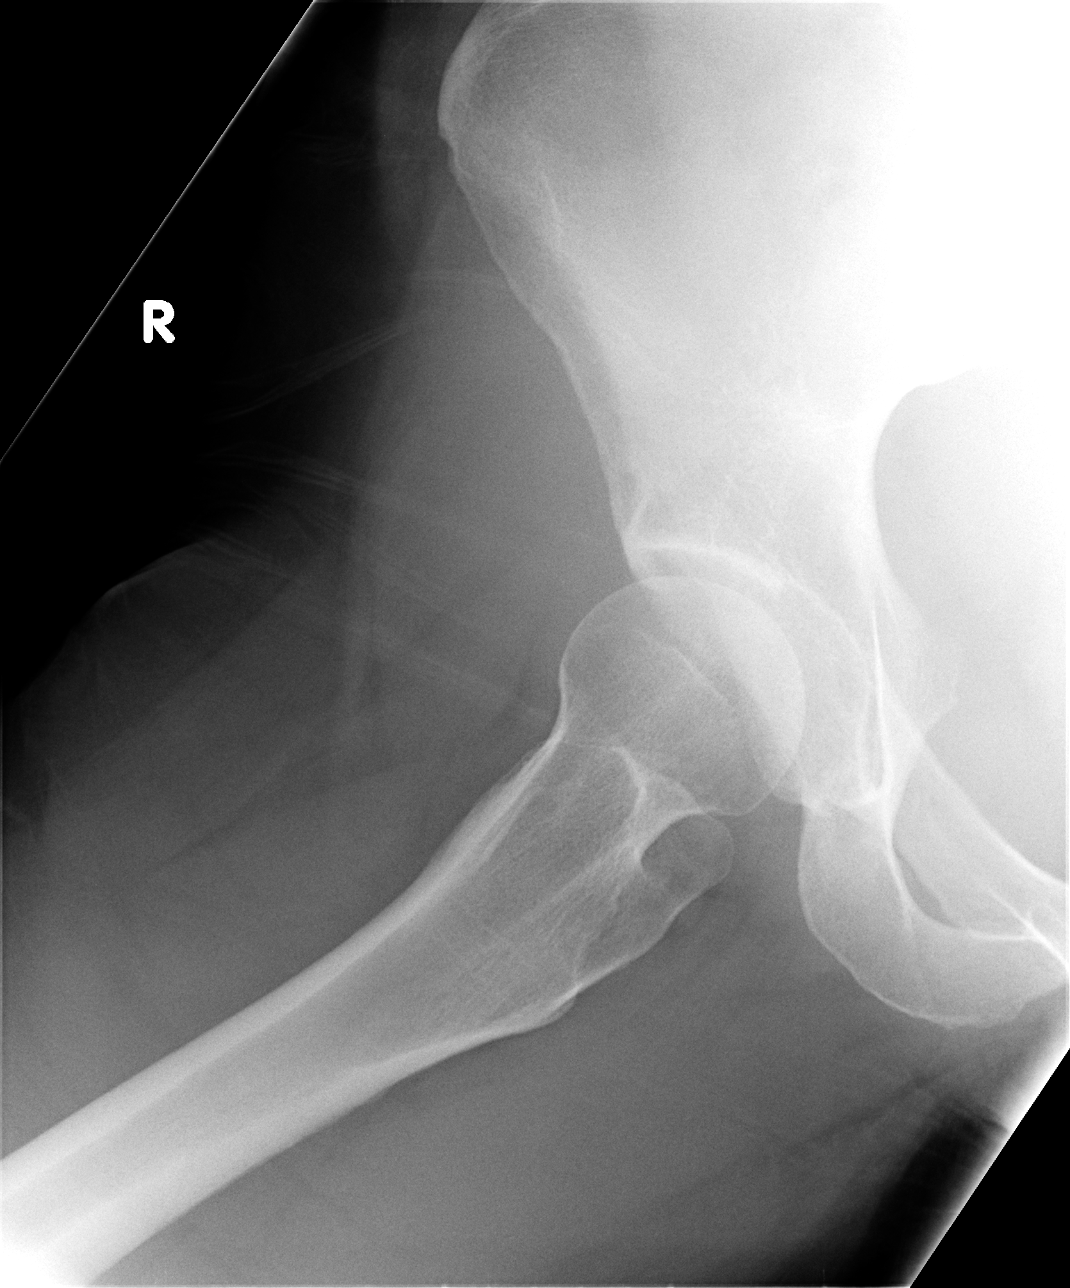

[3 of 3 positions shown; findings below may reference images not displayed]

FINDINGS: The hip joint spaces appear symmetrical and normal for age. No
significant degenerative joint disease is seen. The pelvic rami are
intact. The SI joints are unremarkable. There is soft tissue
fullness to the pelvis which could be due to distended urinary
bladder, prominent uterus, or possibly soft tissue pelvic mass.
Clinical correlation is recommended.
IMPRESSION: 1. Negative views of the hips.
2. Soft tissue fullness of the pelvis as noted above. Correlate
clinically
